# Patient Record
Sex: Female | Born: 1940 | Race: White | Hispanic: No | Marital: Married | Smoking: Former smoker
Health system: Southern US, Community
[De-identification: ages and names within clinical notes are randomized; demographics above are authoritative.]

## PROBLEM LIST (undated history)

## (undated) DIAGNOSIS — M810 Age-related osteoporosis without current pathological fracture: Secondary | ICD-10-CM

## (undated) DIAGNOSIS — N3281 Overactive bladder: Secondary | ICD-10-CM

## (undated) DIAGNOSIS — R51 Headache: Secondary | ICD-10-CM

## (undated) DIAGNOSIS — G35 Multiple sclerosis: Secondary | ICD-10-CM

## (undated) DIAGNOSIS — N189 Chronic kidney disease, unspecified: Secondary | ICD-10-CM

## (undated) DIAGNOSIS — K5090 Crohn's disease, unspecified, without complications: Secondary | ICD-10-CM

## (undated) DIAGNOSIS — R519 Headache, unspecified: Secondary | ICD-10-CM

## (undated) HISTORY — PX: ILEOSTOMY: SHX1783

## (undated) HISTORY — PX: TONSILLECTOMY: SUR1361

## (undated) HISTORY — PX: OTHER SURGICAL HISTORY: SHX169

## (undated) HISTORY — PX: FRACTURE SURGERY: SHX138

## (undated) HISTORY — PX: ABDOMINAL HYSTERECTOMY: SHX81

---

## 2018-03-26 ENCOUNTER — Inpatient Hospital Stay
Admission: EM | Admit: 2018-03-26 | Discharge: 2018-03-30 | DRG: 393 | Disposition: A | Discharge status: Skilled Nursing Facility | Payer: Medicare Other | Source: Skilled Nursing Facility | Attending: Internal Medicine | Admitting: Internal Medicine

## 2018-03-26 DIAGNOSIS — Z9842 Cataract extraction status, left eye: Secondary | ICD-10-CM

## 2018-03-26 DIAGNOSIS — M810 Age-related osteoporosis without current pathological fracture: Secondary | ICD-10-CM | POA: Diagnosis present

## 2018-03-26 DIAGNOSIS — G8929 Other chronic pain: Secondary | ICD-10-CM | POA: Diagnosis present

## 2018-03-26 DIAGNOSIS — Z90710 Acquired absence of both cervix and uterus: Secondary | ICD-10-CM

## 2018-03-26 DIAGNOSIS — G35 Multiple sclerosis: Secondary | ICD-10-CM | POA: Diagnosis present

## 2018-03-26 DIAGNOSIS — D539 Nutritional anemia, unspecified: Secondary | ICD-10-CM | POA: Diagnosis present

## 2018-03-26 DIAGNOSIS — N390 Urinary tract infection, site not specified: Secondary | ICD-10-CM

## 2018-03-26 DIAGNOSIS — K9411 Enterostomy hemorrhage: Secondary | ICD-10-CM | POA: Diagnosis not present

## 2018-03-26 DIAGNOSIS — K8080 Other cholelithiasis without obstruction: Secondary | ICD-10-CM

## 2018-03-26 DIAGNOSIS — F0390 Unspecified dementia without behavioral disturbance: Secondary | ICD-10-CM | POA: Diagnosis present

## 2018-03-26 DIAGNOSIS — N189 Chronic kidney disease, unspecified: Secondary | ICD-10-CM | POA: Diagnosis present

## 2018-03-26 DIAGNOSIS — Z9841 Cataract extraction status, right eye: Secondary | ICD-10-CM

## 2018-03-26 DIAGNOSIS — N3281 Overactive bladder: Secondary | ICD-10-CM | POA: Diagnosis present

## 2018-03-26 DIAGNOSIS — N200 Calculus of kidney: Secondary | ICD-10-CM | POA: Diagnosis not present

## 2018-03-26 DIAGNOSIS — Z9049 Acquired absence of other specified parts of digestive tract: Secondary | ICD-10-CM

## 2018-03-26 DIAGNOSIS — Z79899 Other long term (current) drug therapy: Secondary | ICD-10-CM

## 2018-03-26 DIAGNOSIS — Z6820 Body mass index (BMI) 20.0-20.9, adult: Secondary | ICD-10-CM

## 2018-03-26 DIAGNOSIS — N179 Acute kidney failure, unspecified: Secondary | ICD-10-CM | POA: Diagnosis present

## 2018-03-26 DIAGNOSIS — K5090 Crohn's disease, unspecified, without complications: Secondary | ICD-10-CM | POA: Diagnosis present

## 2018-03-26 DIAGNOSIS — Z66 Do not resuscitate: Secondary | ICD-10-CM | POA: Diagnosis present

## 2018-03-26 DIAGNOSIS — N136 Pyonephrosis: Secondary | ICD-10-CM | POA: Diagnosis present

## 2018-03-26 DIAGNOSIS — K219 Gastro-esophageal reflux disease without esophagitis: Secondary | ICD-10-CM | POA: Diagnosis present

## 2018-03-26 DIAGNOSIS — K922 Gastrointestinal hemorrhage, unspecified: Secondary | ICD-10-CM

## 2018-03-26 DIAGNOSIS — Z87891 Personal history of nicotine dependence: Secondary | ICD-10-CM

## 2018-03-26 DIAGNOSIS — E43 Unspecified severe protein-calorie malnutrition: Secondary | ICD-10-CM

## 2018-03-26 HISTORY — DX: Headache, unspecified: R51.9

## 2018-03-26 HISTORY — DX: Overactive bladder: N32.81

## 2018-03-26 HISTORY — DX: Multiple sclerosis: G35

## 2018-03-26 HISTORY — DX: Chronic kidney disease, unspecified: N18.9

## 2018-03-26 HISTORY — DX: Age-related osteoporosis without current pathological fracture: M81.0

## 2018-03-26 HISTORY — DX: Crohn's disease, unspecified, without complications: K50.90

## 2018-03-26 HISTORY — DX: Headache: R51

## 2018-03-26 NOTE — ED Triage Notes (Signed)
Patient presents to Emergency Department via EMS with complaints of bleeding and pain around ileostomy site.  Per EMS pt has what smells like GI bleed from ostomy bag.  Ostomy placed d/t Crohn's   Per pt pain and bleeding are from staff at [facility] ripping off pt's tape that holds the ostomy biscuit.  Pt is tremulous and credits this to staff not giving pt her hydrocodone that pt has been taking for the last 4 years for left shoulder pian/injury.  HPOA is daughter: Autumn Patty, [phone]

## 2018-03-27 ENCOUNTER — Encounter: Payer: Self-pay | Admitting: Internal Medicine

## 2018-03-27 ENCOUNTER — Other Ambulatory Visit: Payer: Self-pay

## 2018-03-27 ENCOUNTER — Emergency Department: Payer: Medicare Other

## 2018-03-27 DIAGNOSIS — Z980 Intestinal bypass and anastomosis status: Secondary | ICD-10-CM | POA: Diagnosis not present

## 2018-03-27 DIAGNOSIS — D539 Nutritional anemia, unspecified: Secondary | ICD-10-CM | POA: Diagnosis not present

## 2018-03-27 DIAGNOSIS — K922 Gastrointestinal hemorrhage, unspecified: Secondary | ICD-10-CM | POA: Diagnosis present

## 2018-03-27 DIAGNOSIS — K8681 Exocrine pancreatic insufficiency: Secondary | ICD-10-CM

## 2018-03-27 LAB — URINALYSIS, COMPLETE (UACMP) WITH MICROSCOPIC
Bilirubin Urine: NEGATIVE
Glucose, UA: NEGATIVE mg/dL
Hgb urine dipstick: NEGATIVE
Ketones, ur: NEGATIVE mg/dL
Nitrite: NEGATIVE
PH: 5 (ref 5.0–8.0)
Protein, ur: 30 mg/dL — AB
SPECIFIC GRAVITY, URINE: 1.021 (ref 1.005–1.030)
SQUAMOUS EPITHELIAL / LPF: NONE SEEN (ref 0–5)
WBC, UA: >50 WBC/hpf — ABNORMAL HIGH (ref 0–5)

## 2018-03-27 LAB — COMPREHENSIVE METABOLIC PANEL
ALBUMIN: 3.2 g/dL — AB (ref 3.5–5.0)
ALK PHOS: 59 U/L (ref 38–126)
ALT: 11 U/L — ABNORMAL LOW (ref 14–54)
AST: 27 U/L (ref 15–41)
Anion gap: 8 (ref 5–15)
BUN: 38 mg/dL — ABNORMAL HIGH (ref 6–20)
CALCIUM: 8.9 mg/dL (ref 8.9–10.3)
CO2: 19 mmol/L — AB (ref 22–32)
Chloride: 116 mmol/L — ABNORMAL HIGH (ref 101–111)
Creatinine, Ser: 2.14 mg/dL — ABNORMAL HIGH (ref 0.44–1.00)
GFR calc Af Amer: 25 mL/min — ABNORMAL LOW (ref >60)
GFR calc non Af Amer: 22 mL/min — ABNORMAL LOW (ref >60)
GLUCOSE: 100 mg/dL — AB (ref 65–99)
POTASSIUM: 3.3 mmol/L — AB (ref 3.5–5.1)
SODIUM: 143 mmol/L (ref 135–145)
Total Bilirubin: 0.4 mg/dL (ref 0.3–1.2)
Total Protein: 6.4 g/dL — ABNORMAL LOW (ref 6.5–8.1)

## 2018-03-27 LAB — PROTIME-INR
INR: 0.99
Prothrombin Time: 13 seconds (ref 11.4–15.2)

## 2018-03-27 LAB — CBC WITH DIFFERENTIAL/PLATELET
BASOS PCT: 1 %
Basophils Absolute: 0.1 10*3/uL (ref 0–0.1)
EOS ABS: 0.1 10*3/uL (ref 0–0.7)
Eosinophils Relative: 1 %
HCT: 28.6 % — ABNORMAL LOW (ref 35.0–47.0)
HEMOGLOBIN: 9.3 g/dL — AB (ref 12.0–16.0)
Lymphocytes Relative: 15 %
Lymphs Abs: 1.5 10*3/uL (ref 1.0–3.6)
MCH: 32.6 pg (ref 26.0–34.0)
MCHC: 32.5 g/dL (ref 32.0–36.0)
MCV: 100.4 fL — ABNORMAL HIGH (ref 80.0–100.0)
MONOS PCT: 5 %
Monocytes Absolute: 0.5 10*3/uL (ref 0.2–0.9)
NEUTROS PCT: 78 %
Neutro Abs: 7.6 10*3/uL — ABNORMAL HIGH (ref 1.4–6.5)
Platelets: 502 10*3/uL — ABNORMAL HIGH (ref 150–440)
RBC: 2.85 MIL/uL — AB (ref 3.80–5.20)
RDW: 19.2 % — ABNORMAL HIGH (ref 11.5–14.5)
WBC: 9.7 10*3/uL (ref 3.6–11.0)

## 2018-03-27 LAB — HEMOGLOBIN A1C
Hgb A1c MFr Bld: 4.4 % — ABNORMAL LOW (ref 4.8–5.6)
Mean Plasma Glucose: 79.58 mg/dL

## 2018-03-27 LAB — HEMOGLOBIN AND HEMATOCRIT, BLOOD
HEMATOCRIT: 22 % — AB (ref 35.0–47.0)
HEMOGLOBIN: 7.1 g/dL — AB (ref 12.0–16.0)

## 2018-03-27 LAB — FOLATE: FOLATE: 40 ng/mL (ref >5.9)

## 2018-03-27 LAB — IRON AND TIBC
Iron: 42 ug/dL (ref 28–170)
SATURATION RATIOS: 19 % (ref 10.4–31.8)
TIBC: 220 ug/dL — AB (ref 250–450)
UIBC: 178 ug/dL

## 2018-03-27 LAB — FERRITIN: Ferritin: 526 ng/mL — ABNORMAL HIGH (ref 11–307)

## 2018-03-27 LAB — TSH: TSH: 5.966 u[IU]/mL — ABNORMAL HIGH (ref 0.350–4.500)

## 2018-03-27 LAB — LIPASE, BLOOD: Lipase: 60 U/L — ABNORMAL HIGH (ref 11–51)

## 2018-03-27 LAB — VITAMIN B12: VITAMIN B 12: 1397 pg/mL — AB (ref 180–914)

## 2018-03-27 LAB — TROPONIN I: Troponin I: <0.03 ng/mL (ref <0.03)

## 2018-03-27 MED ORDER — TRAMADOL HCL 50 MG PO TABS
50.0000 mg | ORAL_TABLET | Freq: Three times a day (TID) | ORAL | Status: DC | PRN
  Administered 2018-03-27 – 2018-03-30 (×7): 50 mg via ORAL
  Filled 2018-03-27 (×8): qty 1

## 2018-03-27 MED ORDER — ONDANSETRON HCL 4 MG/2ML IJ SOLN
4.0000 mg | Freq: Four times a day (QID) | INTRAMUSCULAR | Status: DC | PRN
  Administered 2018-03-30: 4 mg via INTRAVENOUS
  Filled 2018-03-27: qty 2

## 2018-03-27 MED ORDER — SODIUM CHLORIDE 0.9 % IV SOLN
1.0000 g | Freq: Once | INTRAVENOUS | Status: AC
  Administered 2018-03-27: 1 g via INTRAVENOUS
  Filled 2018-03-27: qty 10

## 2018-03-27 MED ORDER — POTASSIUM CHLORIDE IN NACL 20-0.9 MEQ/L-% IV SOLN
INTRAVENOUS | Status: DC
  Administered 2018-03-27 (×2): via INTRAVENOUS
  Filled 2018-03-27 (×6): qty 1000

## 2018-03-27 MED ORDER — DOCUSATE SODIUM 100 MG PO CAPS
100.0000 mg | ORAL_CAPSULE | Freq: Two times a day (BID) | ORAL | Status: DC
  Filled 2018-03-27 (×3): qty 1

## 2018-03-27 MED ORDER — SODIUM CHLORIDE 0.9 % IV SOLN
1.0000 g | INTRAVENOUS | Status: DC
  Administered 2018-03-27 – 2018-03-28 (×2): 1 g via INTRAVENOUS
  Filled 2018-03-27: qty 10
  Filled 2018-03-27 (×2): qty 1
  Filled 2018-03-27: qty 10

## 2018-03-27 MED ORDER — PANCRELIPASE (LIP-PROT-AMYL) 12000-38000 UNITS PO CPEP
72000.0000 [IU] | ORAL_CAPSULE | Freq: Three times a day (TID) | ORAL | Status: DC
  Administered 2018-03-27 – 2018-03-30 (×6): 72000 [IU] via ORAL
  Filled 2018-03-27 (×10): qty 6

## 2018-03-27 MED ORDER — FAMOTIDINE IN NACL 20-0.9 MG/50ML-% IV SOLN
20.0000 mg | INTRAVENOUS | Status: DC
  Administered 2018-03-27 – 2018-03-30 (×4): 20 mg via INTRAVENOUS
  Filled 2018-03-27 (×4): qty 50

## 2018-03-27 MED ORDER — ONDANSETRON HCL 4 MG PO TABS: 4.0000 mg | ORAL_TABLET | Freq: Four times a day (QID) | ORAL | Status: DC | PRN

## 2018-03-27 MED ORDER — ENSURE ENLIVE PO LIQD
237.0000 mL | Freq: Three times a day (TID) | ORAL | Status: DC
  Administered 2018-03-27 – 2018-03-29 (×4): 237 mL via ORAL

## 2018-03-27 MED ORDER — ACETAMINOPHEN 325 MG PO TABS: 650.0000 mg | ORAL_TABLET | Freq: Four times a day (QID) | ORAL | Status: DC | PRN

## 2018-03-27 MED ORDER — ACETAMINOPHEN 650 MG RE SUPP: 650.0000 mg | Freq: Four times a day (QID) | RECTAL | Status: DC | PRN

## 2018-03-27 MED ORDER — SODIUM CHLORIDE 0.9 % IV BOLUS
1000.0000 mL | Freq: Once | INTRAVENOUS | Status: AC
  Administered 2018-03-27: 1000 mL via INTRAVENOUS

## 2018-03-27 MED ORDER — IOPAMIDOL (ISOVUE-300) INJECTION 61%
15.0000 mL | INTRAVENOUS | Status: DC
  Administered 2018-03-27: 15 mL via ORAL

## 2018-03-27 NOTE — Progress Notes (Signed)
Patient is tired of fighting and unsure why she has been taken to the hospital again. Patient asked Chaplain to contact her daughter and let her know that she is a patient. Patient did not want the Chaplain to contact her husband, who has dementia. Chaplain offered active listening, emotional and spiritual support, and prayer.

## 2018-03-27 NOTE — Progress Notes (Signed)
   03/27/18 0755  Clinical Encounter Type  Visited With Patient not available  Visit Type Initial  Referral From Nurse  Consult/Referral To Chaplain  Spiritual Encounters  Spiritual Needs Other (Comment)   CH received an OR to pray with PT. CH reported to PT's RM and PT was receiving care. CH will pass OR to oncoming CH.

## 2018-03-27 NOTE — NC FL2 (Signed)
[place] MEDICAID FL2 LEVEL OF CARE SCREENING TOOL     IDENTIFICATION  Patient Name: Adrienne Patel Birthdate: 09/29/1941 Sex: female Admission Date (Current Location): 03/26/2018  [place] and [place] Number:  Chiropodist and Address:  [facility], [address] [place], [place] [address]      Provider Number: [id number]  Attending Physician Name and Address:  Ramonita Lab, MD  Relative Name and Phone Number:  Autumn Patty (daughter) [phone]    Current Level of Care: Hospital Recommended Level of Care: Skilled Nursing Facility Prior Approval Number:    Date Approved/Denied:   PASRR Number: [id number]  Discharge Plan: SNF    Current Diagnoses: Patient Active Problem List   Diagnosis Date Noted  . GIB (gastrointestinal bleeding) 03/27/2018    Orientation RESPIRATION BLADDER Height & Weight     Self, Time, Situation, Place  Normal Incontinent Weight: 145 lb (65.8 kg) Height:  5\' 5"  (165.1 cm)  BEHAVIORAL SYMPTOMS/MOOD NEUROLOGICAL BOWEL NUTRITION STATUS      Colostomy Diet(As listed on the discharge summary)  AMBULATORY STATUS COMMUNICATION OF NEEDS Skin   Extensive Assist Verbally Skin abrasions                       Personal Care Assistance Level of Assistance  Bathing, Feeding, Dressing Bathing Assistance: Limited assistance Feeding assistance: Independent Dressing Assistance: Limited assistance     Functional Limitations Info  Sight, Hearing, Speech Sight Info: Adequate Hearing Info: Adequate Speech Info: Adequate    SPECIAL CARE FACTORS FREQUENCY  PT (By licensed PT)     PT Frequency: Resumption of PT originally ordered for short term rehabilitation              Contractures Contractures Info: Not present    Additional Factors Info  Code Status, Allergies Code Status Info: DNR Allergies Info: No known Allergies           Current Medications (03/27/2018):  This is the current hospital  active medication list Current Facility-Administered Medications  Medication Dose Route Frequency Provider Last Rate Last Dose  . 0.9 % NaCl with KCl 20 mEq/ L  infusion   Intravenous Continuous Arnaldo Natal, MD 100 mL/hr at 03/27/18 2774    . acetaminophen (TYLENOL) tablet 650 mg  650 mg Oral Q6H PRN Arnaldo Natal, MD       Or  . acetaminophen (TYLENOL) suppository 650 mg  650 mg Rectal Q6H PRN Arnaldo Natal, MD      . cefTRIAXone (ROCEPHIN) 1 g in sodium chloride 0.9 % 100 mL IVPB  1 g Intravenous Q24H Arnaldo Natal, MD      . docusate sodium (COLACE) capsule 100 mg  100 mg Oral BID Arnaldo Natal, MD      . famotidine (PEPCID) IVPB 20 mg premix  20 mg Intravenous Q24H Arnaldo Natal, MD   Stopped at 03/27/18 0830  . ondansetron (ZOFRAN) tablet 4 mg  4 mg Oral Q6H PRN Arnaldo Natal, MD       Or  . ondansetron [facility]) injection 4 mg  4 mg Intravenous Q6H PRN Arnaldo Natal, MD      . traMADol Janean Sark) tablet 50 mg  50 mg Oral Q8H PRN Ramonita Lab, MD         Discharge Medications: Please see discharge summary for a list of discharge medications.  Relevant Imaging Results:  Relevant Lab Results:   Additional Information SS# [id number]  Clydie Braun  M Laure Leone, LCSW

## 2018-03-27 NOTE — ED Notes (Signed)
Robin, RN called for pt arrival pending and new IV site, rocephin finished att

## 2018-03-27 NOTE — ED Provider Notes (Signed)
[facility] Emergency Department Provider Note   ____________________________________________   First MD Initiated Contact with Patient 03/27/18 0000     (approximate)  I have reviewed the triage vital signs and the nursing notes.   HISTORY  Chief Complaint bleeding from ileostomy site  Level 5 caveat: Limited by patient's dementia  HPI Adrienne Patel is a 77 y.o. female sent to the ED via EMS from skilled nursing facility with a chief complaint of GI bleed.  Patient with a history of Crohn's disease status post ileostomy.  Staff report GI bleed this evening.  Patient reports she was asleep and EMS woke her up for transport.  Takes hydrocodone for chronic pain.  Her only complaint is that she was not given her usual evening Norco.  Denies chest pain, shortness of breath, abdominal pain, nausea, vomiting.  Reports normal volume of stool in her ostomy bag this weekend.  Denies use of anticoagulants.   Past Medical History . Anemia  . Anxiety  . Arthritis  . Cataract  . Crohn's disease (CMS-HCC)  . Deep vein thrombosis (CMS-HCC)  . Dementia  . Depression  . Fractures  . GERD (gastroesophageal reflux disease)  . History of transfusion  . Multiple sclerosis (CMS-HCC)  . Osteoporosis  . Peptic ulceration  . Shoulder injury   Past Surgical History . ABDOMINAL SURGERY  . APPENDECTOMY  . CATARACT EXTRACTION Right 12/26/2008  [facility]  . CATARACT EXTRACTION Left 02/06/2009  [facility]  . COLON SURGERY  . EYE SURGERY  . FRACTURE SURGERY  . HYSTERECTOMY 1980's  . ILEOSTOMY  . many  . PR ILEOSCOPY THRU STOMA DX W/COLLJ SPEC WHEN PRFMD N/A 08/18/2013  Procedure: ILEOSCOPY-STOMA; DX W/WO COLLEC SPECMN (SEP PRO); Surgeon: Theadore Nan, MD; Location: [facility] PROCEDURES [facility]; Service: Gastroenterology  . PR ILEOSCOPY THRU STOMA,BIOPSY N/A 02/27/2013  Procedure: Karren Cobble; [place] Saa 1/MX; Surgeon: Billie Ruddy, MD; [facility]; Service: Gastroenterology  . PR ILEOSCOPY THRU STOMA,BIOPSY N/A 03/02/2013  Procedure: Karren Cobble; [place] Saa 1/MX; Surgeon: Theadore Nan, MD; [facility]; Service: Gastroenterology  . PR UPPER GI ENDOSCOPY,BIOPSY N/A 08/18/2013  Procedure: UGI ENDOSCOPY; WITH BIOPSY, SINGLE OR MULTIPLE; Surgeon: Theadore Nan, MD; Location: [facility] PROC[facility]URES [facility]; Service: Gastroenterology  . PR UPPER GI ENDOSCOPY,DIAGNOSIS N/A 02/27/2013  Procedure: UGI ENDO, INCLUDE ESOPHAGUS, STOMACH, & DUODENUM &/OR JEJUNUM; DX W/WO COLLECTION SPECIMN, BY BRUSH OR WASH; Surgeon: Billie Ruddy, MD; Location: [facility]; Service: Gastroenterology  . TONSILLECTOMY   Patient Active Problem List   Diagnosis Date Noted  . GIB (gastrointestinal bleeding) 03/27/2018    Prior to Admission medications   Medication Sig Start Date End Date Taking? Authorizing Provider  acetaminophen (TYLENOL) 500 MG tablet Take 1,000 mg by mouth every 8 (eight) hours.   Yes [provider]  amphetamine-dextroamphetamine (ADDERALL XR) 15 MG 24 hr capsule Take 30 mg by mouth every morning.   Yes [provider]  Cholecalciferol (VITAMIN D3) POWD Take 800 Units by mouth daily.   Yes [provider]  cyanocobalamin (,VITAMIN B-12,) 1000 MCG/ML injection Inject 1,000 mcg into the muscle every 30 (thirty) days.   Yes [provider]  escitalopram (LEXAPRO) 20 MG tablet Take 20 mg by mouth daily.   Yes [provider]  fesoterodine (TOVIAZ) 4 MG TB24 tablet Take 4 mg by mouth daily.   Yes [provider]  magnesium gluconate (MAGONATE) 500 MG tablet Take 500 mg by mouth 2 (two) times  daily.   Yes [provider]  Multiple Vitamin (MULTIVITAMIN) capsule Take 2 capsules by mouth daily.   Yes [provider]  pantoprazole (PROTONIX) 40 MG tablet Take 40 mg by mouth daily.   Yes [provider]  tuberculin 5 UNIT/0.1ML  injection Inject 5 Units into the skin every 7 (seven) days. 03/15/18 03/29/18 Yes [provider]    Allergies Patient has no known allergies.  No family history on file.  Social History Social History   Tobacco Use  . Smoking status: Not on file  Substance Use Topics  . Alcohol use: Not on file  . Drug use: Not on file  Non-smoker  Review of Systems  Constitutional: No fever/chills Eyes: No visual changes. ENT: No sore throat. Cardiovascular: Denies chest pain. Respiratory: Denies shortness of breath. Gastrointestinal: Positive for GI bleed.  No abdominal pain.  No nausea, no vomiting.  No diarrhea.  No constipation. Genitourinary: Negative for dysuria. Musculoskeletal: Negative for back pain. Skin: Negative for rash. Neurological: Negative for headaches, focal weakness or numbness.   ____________________________________________   PHYSICAL EXAM:  VITAL SIGNS: ED Triage Vitals  Enc Vitals Group     BP 03/26/18 2359 111/73     Pulse Rate 03/26/18 2359 (!) 103     Resp 03/26/18 2359 20     Temp 03/26/18 2359 97.7 F (36.5 C)     Temp Source 03/26/18 2359 Oral     SpO2 03/26/18 2354 100 %     Weight 03/27/18 0000 145 lb (65.8 kg)     Height 03/27/18 0000 5\' 5"  (1.651 m)     Head Circumference --      Peak Flow --      Pain Score 03/27/18 0000 6     Pain Loc --      Pain Edu? --      Excl. in GC? --     Constitutional: Alert and oriented.  Elderly appearing and in no acute distress. Eyes: Conjunctivae are normal. PERRL. EOMI. Head: Atraumatic. Nose: No congestion/rhinnorhea. Mouth/Throat: Mucous membranes are moist.  Oropharynx non-erythematous. Neck: No stridor.   Cardiovascular: Normal rate, regular rhythm. Grossly normal heart sounds.  Good peripheral circulation. Respiratory: Normal respiratory effort.  No retractions. Lungs CTAB. Gastrointestinal: Ostomy bag removed to find dark, gross blood mixed with stool.  Ostomy site cleaned with gauze.   Slow venous oozing noted.  Soft and nontender to light or deep palpation. No distention. No abdominal bruits. No CVA tenderness. Musculoskeletal: No lower extremity tenderness nor edema.  No joint effusions. Neurologic:  Normal speech and language. No gross focal neurologic deficits are appreciated.  Skin:  Skin is warm, dry and intact. No rash noted. Psychiatric: Mood and affect are normal. Speech and behavior are normal.  ____________________________________________   LABS (all labs ordered are listed, but only abnormal results are displayed)  Labs Reviewed  CBC WITH DIFFERENTIAL/PLATELET - Abnormal; Notable for the following components:      Result Value   RBC 2.85 (*)    Hemoglobin 9.3 (*)    HCT 28.6 (*)    MCV 100.4 (*)    RDW 19.2 (*)    Platelets 502 (*)    Neutro Abs 7.6 (*)    All other components within normal limits  COMPREHENSIVE METABOLIC PANEL - Abnormal; Notable for the following components:   Potassium 3.3 (*)    Chloride 116 (*)    CO2 19 (*)    Glucose, Bld 100 (*)  BUN 38 (*)    Creatinine, Ser 2.14 (*)    Total Protein 6.4 (*)    Albumin 3.2 (*)    ALT 11 (*)    GFR calc non Af Amer 22 (*)    GFR calc Af Amer 25 (*)    All other components within normal limits  LIPASE, BLOOD - Abnormal; Notable for the following components:   Lipase 60 (*)    All other components within normal limits  URINALYSIS, COMPLETE (UACMP) WITH MICROSCOPIC - Abnormal; Notable for the following components:   Color, Urine AMBER (*)    APPearance CLOUDY (*)    Protein, ur 30 (*)    Leukocytes, UA LARGE (*)    WBC, UA >50 (*)    Bacteria, UA RARE (*)    All other components within normal limits  URINE CULTURE  TROPONIN I  PROTIME-INR  TYPE AND SCREEN   ____________________________________________  EKG  ED ECG REPORT I, Nell Schrack J, the attending physician, personally viewed and interpreted this ECG.   Date: 03/27/2018  EKG Time: 0048  Rate: 97  Rhythm: normal EKG,  normal sinus rhythm  Axis: Normal  Intervals:none  ST&T Change: Nonspecific  ____________________________________________  RADIOLOGY  ED MD interpretation: Peribronchial thickening; CT without inflammatory changes to the bowel, cholelithiasis and nephrolithiasis  Official radiology report(s): Ct Abdomen Pelvis Wo Contrast  Result Date: 03/27/2018 CLINICAL DATA:  Pain and bleeding via ileostomy site. History of Crohn's disease. EXAM: CT ABDOMEN AND PELVIS WITHOUT CONTRAST TECHNIQUE: Multidetector CT imaging of the abdomen and pelvis was performed following the standard protocol without IV contrast. Enteric contrast administered. COMPARISON:  None. FINDINGS: LOWER CHEST: Minimal atelectasis/scarring. The visualized heart size is normal. Partially imaged coronary artery calcification. No pericardial effusion. HEPATOBILIARY: Multiple mildly dense gallstones with mild gallbladder wall thickening. Common bile duct is 15 mm in transaxial dimension. No identified choledocholithiasis. PANCREAS: Normal. SPLEEN: Normal. ADRENALS/URINARY TRACT: Mildly low-lying LEFT kidney with moderate hydroureteronephrosis. Mildly atrophic LEFT kidney. 1 x 2 cm LEFT renal pelvis calculus. Punctate nonobstructing RIGHT nephrolithiasis. Limited assessment for renal masses by nonenhanced CT. Subcentimeter RIGHT upper pole dense exophytic probable hemorrhagic cyst. The unopacified ureters are normal in course and caliber. Urinary bladder is partially distended and unremarkable. Normal adrenal glands. STOMACH/BOWEL: Surgical clips about the GE junction compatible with fundoplication. Status post gastro jejunostomy with thickened appearance of proximal anastomosis. Contrast in the biliary loop and feeding loops. Contrast to the level of the ileostomy and within bag. Status post colectomy. VASCULAR/LYMPHATIC: Aortoiliac vessels are normal in course and caliber. Moderate calcific atherosclerosis. No lymphadenopathy by CT size criteria.  REPRODUCTIVE: Status post hysterectomy. OTHER: No intraperitoneal free fluid or free air. MUSCULOSKELETAL: Non-acute. Thoracolumbar levoscoliosis. Osteopenia. Severe L2-3 and L3-4 degenerative discs. Anterior abdominal wall scarring. IMPRESSION: 1. Cholelithiasis and suspected acute cholecystitis. Biliary dilatation without identified choledocholithiasis. 2. Age indeterminate thickened gastrojejunostomy anastomosis, possibly inflammatory without bowel obstruction. Status post colectomy. 3. Moderate LEFT hydroureteronephrosis, UVJ stricture or mass possible. 1 x 2 cm LEFT nephrolithiasis. Electronically Signed   By: Awilda Metro M.D.   On: 03/27/2018 03:34   Dg Chest Port 1 View  Result Date: 03/27/2018 CLINICAL DATA:  Acute onset of bleeding and pain about the ileostomy site. EXAM: PORTABLE CHEST 1 VIEW COMPARISON:  None. FINDINGS: The lungs are well-aerated. Peribronchial thickening is noted. Vague right midlung opacity is nonspecific; though it could reflect scarring, pneumonia cannot be excluded. There is no evidence of pleural effusion or pneumothorax. The cardiomediastinal silhouette is within normal  limits. No acute osseous abnormalities are seen. Postoperative change is noted about the gastroesophageal junction. Cervical spinal fusion hardware is noted. IMPRESSION: Peribronchial thickening. Vague right midlung opacity is nonspecific. Though it could reflect scarring, pneumonia cannot be excluded. Followup PA and lateral chest X-ray is recommended in 3-4 weeks following trial of antibiotic therapy (if the patient has symptoms of pneumonia) to ensure resolution and exclude underlying malignancy. Alternatively, CT could be considered to exclude underlying mass. Electronically Signed   By: Roanna Raider M.D.   On: 03/27/2018 00:46    ____________________________________________   PROCEDURES  Procedure(s) performed: None  Procedures  Critical Care performed:  No  ____________________________________________   INITIAL IMPRESSION / ASSESSMENT AND PLAN / ED COURSE  As part of my medical decision making, I reviewed the following data within the electronic MEDICAL RECORD NUMBER Nursing notes reviewed and incorporated, Labs reviewed, EKG interpreted, Old chart reviewed and Notes from prior ED visits   77 year old female with Crohn's disease status post ileostomy who presents with GI bleed.  Differential diagnosis includes but is not limited to Crohn's exacerbation, colitis, esophagitis, etc.  Will obtain screening lab work including INR, type and screen.  Will initiate IV fluid resuscitation.  Norco given for patient's chronic pain.  Will obtain CT abdomen/pelvis to evaluate etiology of patient's symptoms.  Anticipate hospitalization.   Clinical Course as of Mar 27 349  Sun Mar 27, 2018  0123 Reviewed patient's recent admission from [facility]: On 03/15/2018 her H/H was 9.5/29.4   [JS]  0148 BUN/Cr 47/2.10 on 03/15/2018   [JS]  0214 Patient asleep.  Sitter at bedside to assist her to finish oral contrast.  Reexamined ostomy bag which has mild amount of bloody stool.  There is no extensive hemorrhage.   [JS]  0344 Patient asleep.  1 g IV Rocephin ordered for UTI.  CT scan results noted.  Clinically, patient does not have cholecystitis.  She is not tender in her epigastrium or right upper quadrant.  There is no flank pain.  Discussed with hospitalist to evaluate patient in the emergency department for admission for further evaluation of GI bleed.   [JS]    Clinical Course User Index [JS] Irean Hong, MD     ____________________________________________   FINAL CLINICAL IMPRESSION(S) / ED DIAGNOSES  Final diagnoses:  Gastrointestinal hemorrhage, unspecified gastrointestinal hemorrhage type  Urinary tract infection without hematuria, site unspecified  Biliary calculus of other site without obstruction  Kidney stone     ED Discharge Orders    None        Note:  This document was prepared using Dragon voice recognition software and may include unintentional dictation errors.    Irean Hong, MD 03/27/18 [phone]

## 2018-03-27 NOTE — Progress Notes (Signed)
Chaplain spoke with the patient's daughter and relayed her mother's request. Daughter is in route to [employer].

## 2018-03-27 NOTE — ED Notes (Signed)
Pt c/o pain at right IV site, unsure of infiltration, Sheema at pharmacy consulted advised to apply cold compress, Charge Lea, RN notified advised to DC site, pt informed

## 2018-03-27 NOTE — Care Management Note (Addendum)
Case Management Note  Patient Details  Name: Adrienne Patel MRN: [id number] Date of Birth: 12-02-1940  Subjective/Objective:    Patient admitted to [facility] under observation status. Patient is lethargic at times and orientation level is not within normal limits at this time. Per patient her daughter is POA. RNCM called POA Tracy Bullins [phone]. She was able to assist appropriately with the [facility] consult. Per [name], patient is from [facility] and is going through rehab services. She has used [facility] in the past when she was at home with her husband, prior to rehab. Has DME at home such as walker, BSC, and shower chair. Daughter expresses concern over patient returning home since patients spouse has dementia and she has safety concerns about her being at home opposed to rehab. She has used private caregivers in the past as well. [name] reports mild improvement in patients physical strength, ambulatory status and ability to complete activities of daily living. Patient uses [facility] as a pharmacy and has no medication needs.  PCP is Dr Dietrich Pates at [facility] healthcare whom she follows with regularly.             Action/Plan:  Patient with GIB. GI consult pending. Potential return to rehab. Daughter would like to speak with a Child psychotherapist over safety concerns she has at [facility].  Expected Discharge Date:  03/29/18               Expected Discharge Plan:     In-House Referral:     Discharge planning Services     Post Acute Care Choice:    Choice offered to:     DME Arranged:    DME Agency:     HH Arranged:    HH Agency:     Status of Service:     If discussed at [employer], dates discussed:    Additional Comments:  Rehmat Murtagh A, RN 03/27/2018, 9:57 AM

## 2018-03-27 NOTE — Progress Notes (Signed)
[facility] - [place] at [facility]   PATIENT NAME: Adrienne Patel    MR#:  [id number]  DATE OF BIRTH:  01-26-1941  SUBJECTIVE:  CHIEF COMPLAINT: Patient is resting comfortably with no complaints.  Did not notice any blood in her ileostomy today  REVIEW OF SYSTEMS:  CONSTITUTIONAL: No fever, fatigue or weakness.  EYES: No blurred or double vision.  EARS, NOSE, AND THROAT: No tinnitus or ear pain.  RESPIRATORY: No cough, shortness of breath, wheezing or hemoptysis.  CARDIOVASCULAR: No chest pain, orthopnea, edema.  GASTROINTESTINAL: No nausea, vomiting, diarrhea or abdominal pain.  GENITOURINARY: No dysuria, hematuria.  ENDOCRINE: No polyuria, nocturia,  HEMATOLOGY: No anemia, easy bruising or bleeding SKIN: No rash or lesion. MUSCULOSKELETAL: No joint pain or arthritis.   NEUROLOGIC: No tingling, numbness, weakness.  PSYCHIATRY: No anxiety or depression.   DRUG ALLERGIES:  No Known Allergies  VITALS:  Blood pressure (!) 101/53, pulse (!) 115, temperature 98.4 F (36.9 C), temperature source Oral, resp. rate 18, height 5\' 5"  (1.651 m), weight 65.8 kg (145 lb), SpO2 98 %.  PHYSICAL EXAMINATION:  GENERAL:  77 y.o.-year-old patient lying in the bed with no acute distress.  EYES: Pupils equal, round, reactive to light and accommodation. No scleral icterus. Extraocular muscles intact.  HEENT: Head atraumatic, normocephalic. Oropharynx and nasopharynx clear.  NECK:  Supple, no jugular venous distention. No thyroid enlargement, no tenderness.  LUNGS: Normal breath sounds bilaterally, no wheezing, rales,rhonchi or crepitation. No use of accessory muscles of respiration.  CARDIOVASCULAR: S1, S2 normal. No murmurs, rubs, or gallops.  ABDOMEN: Soft, nontender, nondistended. Bowel sounds present.  Ileostomy site intact with brown stool EXTREMITIES: No pedal edema, cyanosis, or clubbing.  NEUROLOGIC: Cranial nerves II through XII are intact. Muscle strength 5/5 in  all extremities. Sensation intact. Gait not checked.  PSYCHIATRIC: The patient is alert and oriented x 3.  SKIN: No obvious rash, lesion, or ulcer.    LABORATORY PANEL:   CBC Recent Labs  Lab 03/27/18 0034  WBC 9.7  HGB 9.3*  HCT 28.6*  PLT 502*   ------------------------------------------------------------------------------------------------------------------  Chemistries  Recent Labs  Lab 03/27/18 0034  NA 143  K 3.3*  CL 116*  CO2 19*  GLUCOSE 100*  BUN 38*  CREATININE 2.14*  CALCIUM 8.9  AST 27  ALT 11*  ALKPHOS 59  BILITOT 0.4   ------------------------------------------------------------------------------------------------------------------  Cardiac Enzymes Recent Labs  Lab 03/27/18 0034  TROPONINI <0.03   ------------------------------------------------------------------------------------------------------------------  RADIOLOGY:  Ct Abdomen Pelvis Wo Contrast  Result Date: 03/27/2018 CLINICAL DATA:  Pain and bleeding via ileostomy site. History of Crohn's disease. EXAM: CT ABDOMEN AND PELVIS WITHOUT CONTRAST TECHNIQUE: Multidetector CT imaging of the abdomen and pelvis was performed following the standard protocol without IV contrast. Enteric contrast administered. COMPARISON:  None. FINDINGS: LOWER CHEST: Minimal atelectasis/scarring. The visualized heart size is normal. Partially imaged coronary artery calcification. No pericardial effusion. HEPATOBILIARY: Multiple mildly dense gallstones with mild gallbladder wall thickening. Common bile duct is 15 mm in transaxial dimension. No identified choledocholithiasis. PANCREAS: Normal. SPLEEN: Normal. ADRENALS/URINARY TRACT: Mildly low-lying LEFT kidney with moderate hydroureteronephrosis. Mildly atrophic LEFT kidney. 1 x 2 cm LEFT renal pelvis calculus. Punctate nonobstructing RIGHT nephrolithiasis. Limited assessment for renal masses by nonenhanced CT. Subcentimeter RIGHT upper pole dense exophytic probable  hemorrhagic cyst. The unopacified ureters are normal in course and caliber. Urinary bladder is partially distended and unremarkable. Normal adrenal glands. STOMACH/BOWEL: Surgical clips about the GE junction compatible with fundoplication. Status post gastro jejunostomy  with thickened appearance of proximal anastomosis. Contrast in the biliary loop and feeding loops. Contrast to the level of the ileostomy and within bag. Status post colectomy. VASCULAR/LYMPHATIC: Aortoiliac vessels are normal in course and caliber. Moderate calcific atherosclerosis. No lymphadenopathy by CT size criteria. REPRODUCTIVE: Status post hysterectomy. OTHER: No intraperitoneal free fluid or free air. MUSCULOSKELETAL: Non-acute. Thoracolumbar levoscoliosis. Osteopenia. Severe L2-3 and L3-4 degenerative discs. Anterior abdominal wall scarring. IMPRESSION: 1. Cholelithiasis and suspected acute cholecystitis. Biliary dilatation without identified choledocholithiasis. 2. Age indeterminate thickened gastrojejunostomy anastomosis, possibly inflammatory without bowel obstruction. Status post colectomy. 3. Moderate LEFT hydroureteronephrosis, UVJ stricture or mass possible. 1 x 2 cm LEFT nephrolithiasis. Electronically Signed   By: Awilda Metro M.D.   On: 03/27/2018 03:34   Dg Chest Port 1 View  Result Date: 03/27/2018 CLINICAL DATA:  Acute onset of bleeding and pain about the ileostomy site. EXAM: PORTABLE CHEST 1 VIEW COMPARISON:  None. FINDINGS: The lungs are well-aerated. Peribronchial thickening is noted. Vague right midlung opacity is nonspecific; though it could reflect scarring, pneumonia cannot be excluded. There is no evidence of pleural effusion or pneumothorax. The cardiomediastinal silhouette is within normal limits. No acute osseous abnormalities are seen. Postoperative change is noted about the gastroesophageal junction. Cervical spinal fusion hardware is noted. IMPRESSION: Peribronchial thickening. Vague right midlung  opacity is nonspecific. Though it could reflect scarring, pneumonia cannot be excluded. Followup PA and lateral chest X-ray is recommended in 3-4 weeks following trial of antibiotic therapy (if the patient has symptoms of pneumonia) to ensure resolution and exclude underlying malignancy. Alternatively, CT could be considered to exclude underlying mass. Electronically Signed   By: Roanna Raider M.D.   On: 03/27/2018 00:46    EKG:   Orders placed or performed during the hospital encounter of 03/26/18  . ED EKG  . ED EKG  . EKG 12-Lead  . EKG 12-Lead    ASSESSMENT AND PLAN:    This is a 77 year old female admitted for GI bleeding. 1.  GI bleed: Melena; she has underlying Crohn's disease.  Patient hemoglobin is glad gradually declining over the past several months. Monitor hemoglobin and hematocrit closely  CT of the abdomen is negative for any acute process. Patient is seen by GI, recommending work-up for macrocytic anemia including ferritin, iron studies, B12, folate, zinc and copper levels Patient is started on Creon and nutrition supplements Ensure 3 times a day Resume diet   Pepcid IV for now (Protonix on national back order).    hemodynamically stable.  2.  UTI: Present on admission; continue Rocephin, urine culture and sensitivity pending  3.  Overactive bladder: Continue Toviaz  4.  DVT prophylaxis: SCDs  5.  GI prophylaxis:  Pepcid     All the records are reviewed and case discussed with Care Management/Social Workerr. Management plans discussed with the patient, family and they are in agreement.  CODE STATUS: dnr  TOTAL TIME TAKING CARE OF THIS PATIENT: 34  minutes.   POSSIBLE D/C IN 1-2 DAYS, DEPENDING ON CLINICAL CONDITION.  Note: This dictation was prepared with Dragon dictation along with smaller phrase technology. Any transcriptional errors that result from this process are unintentional.   Ramonita Lab M.D on 03/27/2018 at 9:24 PM  Between 7am to 6pm -  Pager - [phone] After 6pm go to [email or web address] - password [email or web address]  [facility]  Office  [phone]  CC: Primary care physician; System, Pcp Not In

## 2018-03-27 NOTE — Care Management Obs Status (Signed)
MEDICARE OBSERVATION STATUS NOTIFICATION   Patient Details  Name: Lajuanna Gennette MRN: [id number] Date of Birth: [birth year]   Medicare Observation Status Notification Given:  Yes  Patient not capable of signing letter at this point due to lethargy and AMS. RNCM spoke with daughter Autumn Patty [phone] who is POA. She understands this letter and asks that I sign it with an "X". DAughter will be at bedside later today.   Raquon Milledge A, RN 03/27/2018, 9:54 AM

## 2018-03-27 NOTE — Consult Note (Signed)
Cephas Darby, MD [address]  [place]  [place], [place] [address]  Main: [phone]  Fax: [phone] Pager: [phone]   Consultation  Referring Provider:     No ref. provider found Primary Care Physician:  System, Pcp Not In Primary Gastroenterologist:  Dr. Sherri Sear         Reason for Consultation:     Anemia, bleeding from the ileostomy site  Date of Admission:  03/26/2018 Date of Consultation:  03/27/2018         HPI:   Adrienne Patel is a 77 y.o. female with history of Crohn's disease, status post total proctocolectomy and end ileostomy in 1991, with no evidence of active Crohn's disease and not on any medications, history of multiple sclerosis is sent from nursing home due to concern for bloody output from the ileostomy. Over the past 1 month, her hemoglobin has been gradually declining. Baseline hemoglobin is between 13 and 15, slowly down trending since 03/12/18 from 12.2 to 9.5 on 03/15/2018, normal MCV based on the labs from [facility]. Hemoglobin on admission today is 9.3, MCV 100.4 with thrombocytosis. Patient was told that the ostomy output was maroon-colored. When I saw her today, the ostomy output is brown liquid stool with some solid food particles. She also has history of chronic kidney disease and her BUN/creatinine are in fact lower than her baseline She also has history of Billroth II gastrojejunostomy Patient had multiple endoscopies and upper endoscopies done in 2014 and 2015 for High ostomy output and weight loss. She also has history of low pancreatic fecal elastase <50 on 02/27/2013 at [facility] and was supposed to be on Creon. History of small intestinal bacterial overgrowth, was treated with intermittent Flagyl. Celiac serologies were negative  Patient had a CT A/P without contra was found to have thickened appearance of the proximal  Gastrojejunal anastomosis  NSAIDs: none  Antiplts/Anticoagulants/Anti thrombotics: none  GI Procedures: ileoscopy 07/2013  at [facility] Normal ileum Diagnosis: A: Small bowel, jejunum, biopsy  - Small bowel mucosa with preserved villous morphology - No significant pathologic abnormalities  EGD 07/2013 at [facility] - Normal esophagus.  - Patent Billroth II gastrojejunostomy was found.  - Normal examined jejunal limbs. Biopsied.  Ileoscopy 03/02/2013 Findings:  The area at 3 cm proximal to the stoma contained 2 two-three mm ulcers.   No bleeding was present. Biopsies were taken with a cold forceps for   histology.  The remainder of the exam in the terminal ileum was normal. Random   biopsies were taken for histology.  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  Evidence of a Billroth II anastomosis was found in the mid-stomach. The   anastomosis was characterized by healthy appearing mucosa, and was   widely patent. The stomach was otherwise unremarkable.  The examined jejunum (both the alimentary and biliopancreatic limbs) was   normal.  Diagnosis: A:Stomach, random, biopsy. - No significant pathologic abnormality.  B:Small intestine, jejunum, random, biopsy. - No significant pathologic abnormality. - No villus abnormalities or parasites identified.  Past Medical History:  Diagnosis Date  . CKD (chronic kidney disease)   . Crohn's disease ([place])   . Headache   . MS (  multiple sclerosis) ([place])   . Osteoporosis   . Overactive bladder     Past Surgical History:  Procedure Laterality  Date  . ABDOMINAL HYSTERECTOMY    . ankle left fracture    . DDD     cervical  . FRACTURE SURGERY    . ILEOSTOMY     1982  . TONSILLECTOMY      Prior to Admission medications   Medication Sig Start Date End Date Taking? Authorizing Provider  acetaminophen (TYLENOL) 500 MG tablet Take 1,000 mg by mouth every 8 (eight) hours.   Yes [provider]  amphetamine-dextroamphetamine (ADDERALL XR) 15 MG 24 hr capsule Take 30 mg by mouth every morning.   Yes [provider]  Cholecalciferol (VITAMIN D3) POWD Take 800 Units by mouth daily.   Yes [provider]  cyanocobalamin (,VITAMIN B-12,) 1000 MCG/ML injection Inject 1,000 mcg into the muscle every 30 (thirty) days.   Yes [provider]  escitalopram (LEXAPRO) 20 MG tablet Take 20 mg by mouth daily.   Yes [provider]  fesoterodine (TOVIAZ) 4 MG TB24 tablet Take 4 mg by mouth daily.   Yes [provider]  magnesium gluconate (MAGONATE) 500 MG tablet Take 500 mg by mouth 2 (two) times daily.   Yes [provider]  Multiple Vitamin (MULTIVITAMIN) capsule Take 2 capsules by mouth daily.   Yes [provider]  pantoprazole (PROTONIX) 40 MG tablet Take 40 mg by mouth daily.   Yes [provider]  tuberculin 5 UNIT/0.1ML injection Inject 5 Units into the skin every 7 (seven) days. 03/15/18 03/29/18 Yes [provider]    History reviewed. No pertinent family history.   Social History   Tobacco Use  . Smoking status: Former Smoker    Last attempt to quit: 03/27/1986    Years since quitting: 32.0  . Smokeless tobacco: Never Used  Substance Use Topics  . Alcohol use: Not Currently  . Drug use: Yes    Types: Marijuana    Comment: "Years ago"    Allergies as of 03/26/2018  . (Not on File)    Review of Systems:    All systems reviewed and negative except where noted in HPI.   Physical Exam:  Vital signs in last 24 hours: Temp:  [97.7 F (36.5  C)-98.2 F (36.8 C)] 98.2 F (36.8 C) (06/02 1157) Pulse Rate:  [78-104] 99 (06/02 1157) Resp:  [16-22] 16 (06/02 1157) BP: (104-136)/(65-78) 104/65 (06/02 1157) SpO2:  [97 %-100 %] 100 % (06/02 1157) Weight:  [145 lb (65.8 kg)] 145 lb (65.8 kg) (06/02 0000) Last BM Date: 03/27/18 General:   Ill-appearing, malnourished Head:  Normocephalic and atraumatic. Eyes:   No icterus.   Conjunctiva pale. PERRLA. Ears:  Normal auditory acuity. Neck:  Supple; no masses or thyroidomegaly Lungs: Respirations even and unlabored. Lungs clear to auscultation bilaterally.   No wheezes, crackles, or rhonchi.  Heart:  Regular rate and rhythm;  Without murmur, clicks, rubs or gallops Abdomen:  Soft, nondistended, mild midline tenderness over the midline vertical scar. Normal bowel sounds. No appreciable masses or hepatomegaly.  No rebound or guarding. Ileostomy in the right mid quadrant, liquid brown stool with solid food particles, healthy-appearing stomal mucosa with no stigmata of recent bleeding, stoma is patent Rectal:  n/a Msk:  Symmetrical without gross deformities.  Generalized weakness  Extremities:  Without edema, cyanosis or clubbing. Neurologic:  Alert and oriented x3;  grossly normal neurologically. Skin:  Intact without significant lesions  or rashes. Cervical Nodes:  No significant cervical adenopathy. Psych:  Alert and cooperative. Normal affect.  LAB RESULTS: CBC Latest Ref Rng & Units 03/27/2018  WBC 3.6 - 11.0 K/uL 9.7  Hemoglobin 12.0 - 16.0 g/dL 9.3(L)  Hematocrit 35.0 - 47.0 % 28.6(L)  Platelets 150 - 440 K/uL 502(H)    BMET BMP Latest Ref Rng & Units 03/27/2018  Glucose 65 - 99 mg/dL 100(H)  BUN 6 - 20 mg/dL 38(H)  Creatinine 0.44 - 1.00 mg/dL 2.14(H)  Sodium 135 - 145 mmol/L 143  Potassium 3.5 - 5.1 mmol/L 3.3(L)  Chloride 101 - 111 mmol/L 116(H)  CO2 22 - 32 mmol/L 19(L)  Calcium 8.9 - 10.3 mg/dL 8.9    LFT Hepatic Function Latest Ref Rng & Units 03/27/2018  Total Protein  6.5 - 8.1 g/dL 6.4(L)  Albumin 3.5 - 5.0 g/dL 3.2(L)  AST 15 - 41 U/L 27  ALT 14 - 54 U/L 11(L)  Alk Phosphatase 38 - 126 U/L 59  Total Bilirubin 0.3 - 1.2 mg/dL 0.4     STUDIES: Ct Abdomen Pelvis Wo Contrast  Result Date: 03/27/2018 CLINICAL DATA:  Pain and bleeding via ileostomy site. History of Crohn's disease. EXAM: CT ABDOMEN AND PELVIS WITHOUT CONTRAST TECHNIQUE: Multidetector CT imaging of the abdomen and pelvis was performed following the standard protocol without IV contrast. Enteric contrast administered. COMPARISON:  None. FINDINGS: LOWER CHEST: Minimal atelectasis/scarring. The visualized heart size is normal. Partially imaged coronary artery calcification. No pericardial effusion. HEPATOBILIARY: Multiple mildly dense gallstones with mild gallbladder wall thickening. Common bile duct is 15 mm in transaxial dimension. No identified choledocholithiasis. PANCREAS: Normal. SPLEEN: Normal. ADRENALS/URINARY TRACT: Mildly low-lying LEFT kidney with moderate hydroureteronephrosis. Mildly atrophic LEFT kidney. 1 x 2 cm LEFT renal pelvis calculus. Punctate nonobstructing RIGHT nephrolithiasis. Limited assessment for renal masses by nonenhanced CT. Subcentimeter RIGHT upper pole dense exophytic probable hemorrhagic cyst. The unopacified ureters are normal in course and caliber. Urinary bladder is partially distended and unremarkable. Normal adrenal glands. STOMACH/BOWEL: Surgical clips about the GE junction compatible with fundoplication. Status post gastro jejunostomy with thickened appearance of proximal anastomosis. Contrast in the biliary loop and feeding loops. Contrast to the level of the ileostomy and within bag. Status post colectomy. VASCULAR/LYMPHATIC: Aortoiliac vessels are normal in course and caliber. Moderate calcific atherosclerosis. No lymphadenopathy by CT size criteria. REPRODUCTIVE: Status post hysterectomy. OTHER: No intraperitoneal free fluid or free air. MUSCULOSKELETAL: Non-acute.  Thoracolumbar levoscoliosis. Osteopenia. Severe L2-3 and L3-4 degenerative discs. Anterior abdominal wall scarring. IMPRESSION: 1. Cholelithiasis and suspected acute cholecystitis. Biliary dilatation without identified choledocholithiasis. 2. Age indeterminate thickened gastrojejunostomy anastomosis, possibly inflammatory without bowel obstruction. Status post colectomy. 3. Moderate LEFT hydroureteronephrosis, UVJ stricture or mass possible. 1 x 2 cm LEFT nephrolithiasis. Electronically Signed   By: Elon Alas M.D.   On: 03/27/2018 03:34   Dg Chest Port 1 View  Result Date: 03/27/2018 CLINICAL DATA:  Acute onset of bleeding and pain about the ileostomy site. EXAM: PORTABLE CHEST 1 VIEW COMPARISON:  None. FINDINGS: The lungs are well-aerated. Peribronchial thickening is noted. Vague right midlung opacity is nonspecific; though it could reflect scarring, pneumonia cannot be excluded. There is no evidence of pleural effusion or pneumothorax. The cardiomediastinal silhouette is within normal limits. No acute osseous abnormalities are seen. Postoperative change is noted about the gastroesophageal junction. Cervical spinal fusion hardware is noted. IMPRESSION: Peribronchial thickening. Vague right midlung opacity is nonspecific. Though it could reflect scarring, pneumonia cannot be excluded. Followup PA and lateral chest  X-ray is recommended in 3-4 weeks following trial of antibiotic therapy (if the patient has symptoms of pneumonia) to ensure resolution and exclude underlying malignancy. Alternatively, CT could be considered to exclude underlying mass. Electronically Signed   By: Garald Balding M.D.   On: 03/27/2018 00:46      Impression / Plan:   Adrienne Patel is a 77 y.o. Caucasian female with history of multiple sclerosis, Crohn's disease, status post total proctocolectomy and end ileostomy, Billroth II gastrojejunal anastomosis presents with progressively worsening anemia for the last 3 weeks and  bloody ileostomy output. She also has exocrine pancreatic insufficiency, small bowel diverticula with history of bacterial overgrowth. She has macrocytic anemia. Her anemia is most likely secondary to malabsorption in the setting of upper GI and small bowel surgery. There was no evidence of bleeding from the ileostomy on my exam today.  - recommend workup of macrocytic anemia - check ferritin, iron studies, B12, folate, zinc, copper levels - start Creon 36K units 2 capsules with each meal and one with snack - Defer endoscopic evaluation pending above workup as patient is not actively bleeding at this time - Resume diet, ensure 3 times a day - nutrition consult   Thank you for involving me in the care of this patient.  Dr. Vicente Males will cover from tomorrow    LOS: 0 days   Sherri Sear, MD  03/27/2018, 3:51 PM   Note: This dictation was prepared with Dragon dictation along with smaller phrase technology. Any transcriptional errors that result from this process are unintentional.

## 2018-03-27 NOTE — H&P (Addendum)
Adrienne Patel is an 77 y.o. female.   Chief Complaint: Ileostomy bleeding HPI: The patient with past medical history sniffing and for CKD, Crohn's disease and multiple sclerosis presents to the emergency department from her nursing home with bleeding from her ileostomy site.  The patient states that she believed she had bleeding from the adhesive area around her ileostomy bag when the nursing staff changed it.  However in the emergency department the treatment team did not observe any trauma to the skin.  However when the ileostomy bag was removed the patient did have melanotic stool in place.  The patient admits to some abdominal pain but denies nausea.  She appears to also have some element of dementia which makes her history is somewhat less reliable.  Due to her GI bleeding the emergency department staff called the hospitalist service for admission.  Past Medical History:  Diagnosis Date  . CKD (chronic kidney disease)   . Crohn's disease ([place])   . MS (multiple sclerosis) ([place])   . Osteoporosis   . Overactive bladder     Past Surgical History:  Procedure Laterality Date  . ILEOSTOMY      History reviewed. No pertinent family history. Unknown; cannot reach family by phone  Social History:  has no tobacco, alcohol, and drug history on file.  Allergies: No Known Allergies  Prior to Admission medications   Medication Sig Start Date End Date Taking? Authorizing Provider  acetaminophen (TYLENOL) 500 MG tablet Take 1,000 mg by mouth every 8 (eight) hours.   Yes [provider]  amphetamine-dextroamphetamine (ADDERALL XR) 15 MG 24 hr capsule Take 30 mg by mouth every morning.   Yes [provider]  Cholecalciferol (VITAMIN D3) POWD Take 800 Units by mouth daily.   Yes [provider]  cyanocobalamin (,VITAMIN B-12,) 1000 MCG/ML injection Inject 1,000 mcg into the muscle every 30 (thirty) days.   Yes [provider]  escitalopram (LEXAPRO) 20 MG tablet Take  20 mg by mouth daily.   Yes [provider]  fesoterodine (TOVIAZ) 4 MG TB24 tablet Take 4 mg by mouth daily.   Yes [provider]  magnesium gluconate (MAGONATE) 500 MG tablet Take 500 mg by mouth 2 (two) times daily.   Yes [provider]  Multiple Vitamin (MULTIVITAMIN) capsule Take 2 capsules by mouth daily.   Yes [provider]  pantoprazole (PROTONIX) 40 MG tablet Take 40 mg by mouth daily.   Yes [provider]  tuberculin 5 UNIT/0.1ML injection Inject 5 Units into the skin every 7 (seven) days. 03/15/18 03/29/18 Yes [provider]     Results for orders placed or performed during the hospital encounter of 03/26/18 (from the past 48 hour(s))  CBC with Differential     Status: Abnormal   Collection Time: 03/27/18 12:34 AM  Result Value Ref Range   WBC 9.7 3.6 - 11.0 K/uL   RBC 2.85 (L) 3.80 - 5.20 MIL/uL   Hemoglobin 9.3 (L) 12.0 - 16.0 g/dL   HCT 28.6 (L) 35.0 - 47.0 %   MCV 100.4 (H) 80.0 - 100.0 fL   MCH 32.6 26.0 - 34.0 pg   MCHC 32.5 32.0 - 36.0 g/dL   RDW 19.2 (H) 11.5 - 14.5 %   Platelets 502 (H) 150 - 440 K/uL   Neutrophils Relative % 78 %   Neutro Abs 7.6 (H) 1.4 - 6.5 K/uL   Lymphocytes Relative 15 %   Lymphs Abs 1.5 1.0 - 3.6 K/uL  Monocytes Relative 5 %   Monocytes Absolute 0.5 0.2 - 0.9 K/uL   Eosinophils Relative 1 %   Eosinophils Absolute 0.1 0 - 0.7 K/uL   Basophils Relative 1 %   Basophils Absolute 0.1 0 - 0.1 K/uL    Comment: Performed at [facility], [address] [place], [place] [address]  Comprehensive metabolic panel     Status: Abnormal   Collection Time: 03/27/18 12:34 AM  Result Value Ref Range   Sodium 143 135 - 145 mmol/L   Potassium 3.3 (L) 3.5 - 5.1 mmol/L   Chloride 116 (H) 101 - 111 mmol/L   CO2 19 (L) 22 - 32 mmol/L   Glucose, Bld 100 (H) 65 - 99 mg/dL   BUN 38 (H) 6 - 20 mg/dL   Creatinine, Ser 2.14 (H) 0.44 - 1.00 mg/dL   Calcium 8.9 8.9 - 10.3 mg/dL   Total Protein  6.4 (L) 6.5 - 8.1 g/dL   Albumin 3.2 (L) 3.5 - 5.0 g/dL   AST 27 15 - 41 U/L   ALT 11 (L) 14 - 54 U/L   Alkaline Phosphatase 59 38 - 126 U/L   Total Bilirubin 0.4 0.3 - 1.2 mg/dL   GFR calc non Af Amer 22 (L) >60 mL/min   GFR calc Af Amer 25 (L) >60 mL/min    Comment: (NOTE) The eGFR has been calculated using the CKD EPI equation. This calculation has not been validated in all clinical situations. eGFR's persistently <60 mL/min signify possible Chronic Kidney Disease.    Anion gap 8 5 - 15    Comment: Performed at [facility], [address] [place], [place] [address]  Troponin I     Status: None   Collection Time: 03/27/18 12:34 AM  Result Value Ref Range   Troponin I <0.03 <0.03 ng/mL    Comment: Performed at [facility], [address] [place], [place] [address]  Lipase, blood     Status: Abnormal   Collection Time: 03/27/18 12:34 AM  Result Value Ref Range   Lipase 60 (H) 11 - 51 U/L    Comment: Performed at [facility], [address] [place], [place] [address]  Protime-INR     Status: None   Collection Time: 03/27/18 12:34 AM  Result Value Ref Range   Prothrombin Time 13.0 11.4 - 15.2 seconds   INR 0.99     Comment: Performed at [facility], [address] [place], [place] [address]  Type and screen [place]     Status: None   Collection Time: 03/27/18 12:34 AM  Result Value Ref Range   ABO/RH(D) O NEG    Antibody Screen NEG    Sample Expiration      03/30/2018 Performed at [facility], [address] [place], [place] [address]   Urinalysis, Complete w Microscopic     Status: Abnormal   Collection Time: 03/27/18  2:33 AM  Result Value Ref Range   Color, Urine AMBER (A) YELLOW    Comment: BIOCHEMICALS MAY BE AFFECTED BY COLOR   APPearance CLOUDY (A) CLEAR   Specific Gravity, Urine 1.021 1.005 - 1.030   pH 5.0 5.0 - 8.0   Glucose, UA NEGATIVE NEGATIVE mg/dL   Hgb urine dipstick  NEGATIVE NEGATIVE   Bilirubin Urine NEGATIVE NEGATIVE   Ketones, ur NEGATIVE NEGATIVE mg/dL   Protein, ur 30 (A) NEGATIVE mg/dL   Nitrite NEGATIVE NEGATIVE   Leukocytes, UA LARGE (A) NEGATIVE  RBC / HPF 6-10 0 - 5 RBC/hpf   WBC, UA >50 (H) 0 - 5 WBC/hpf   Bacteria, UA RARE (A) NONE SEEN   Squamous Epithelial / LPF NONE SEEN 0 - 5   WBC Clumps PRESENT     Comment: Performed at [facility], [address] [place], [place] [address]   Ct Abdomen Pelvis Wo Contrast  Result Date: 03/27/2018 CLINICAL DATA:  Pain and bleeding via ileostomy site. History of Crohn's disease. EXAM: CT ABDOMEN AND PELVIS WITHOUT CONTRAST TECHNIQUE: Multidetector CT imaging of the abdomen and pelvis was performed following the standard protocol without IV contrast. Enteric contrast administered. COMPARISON:  None. FINDINGS: LOWER CHEST: Minimal atelectasis/scarring. The visualized heart size is normal. Partially imaged coronary artery calcification. No pericardial effusion. HEPATOBILIARY: Multiple mildly dense gallstones with mild gallbladder wall thickening. Common bile duct is 15 mm in transaxial dimension. No identified choledocholithiasis. PANCREAS: Normal. SPLEEN: Normal. ADRENALS/URINARY TRACT: Mildly low-lying LEFT kidney with moderate hydroureteronephrosis. Mildly atrophic LEFT kidney. 1 x 2 cm LEFT renal pelvis calculus. Punctate nonobstructing RIGHT nephrolithiasis. Limited assessment for renal masses by nonenhanced CT. Subcentimeter RIGHT upper pole dense exophytic probable hemorrhagic cyst. The unopacified ureters are normal in course and caliber. Urinary bladder is partially distended and unremarkable. Normal adrenal glands. STOMACH/BOWEL: Surgical clips about the GE junction compatible with fundoplication. Status post gastro jejunostomy with thickened appearance of proximal anastomosis. Contrast in the biliary loop and feeding loops. Contrast to the level of the ileostomy and within bag. Status post  colectomy. VASCULAR/LYMPHATIC: Aortoiliac vessels are normal in course and caliber. Moderate calcific atherosclerosis. No lymphadenopathy by CT size criteria. REPRODUCTIVE: Status post hysterectomy. OTHER: No intraperitoneal free fluid or free air. MUSCULOSKELETAL: Non-acute. Thoracolumbar levoscoliosis. Osteopenia. Severe L2-3 and L3-4 degenerative discs. Anterior abdominal wall scarring. IMPRESSION: 1. Cholelithiasis and suspected acute cholecystitis. Biliary dilatation without identified choledocholithiasis. 2. Age indeterminate thickened gastrojejunostomy anastomosis, possibly inflammatory without bowel obstruction. Status post colectomy. 3. Moderate LEFT hydroureteronephrosis, UVJ stricture or mass possible. 1 x 2 cm LEFT nephrolithiasis. Electronically Signed   By: Elon Alas M.D.   On: 03/27/2018 03:34   Dg Chest Port 1 View  Result Date: 03/27/2018 CLINICAL DATA:  Acute onset of bleeding and pain about the ileostomy site. EXAM: PORTABLE CHEST 1 VIEW COMPARISON:  None. FINDINGS: The lungs are well-aerated. Peribronchial thickening is noted. Vague right midlung opacity is nonspecific; though it could reflect scarring, pneumonia cannot be excluded. There is no evidence of pleural effusion or pneumothorax. The cardiomediastinal silhouette is within normal limits. No acute osseous abnormalities are seen. Postoperative change is noted about the gastroesophageal junction. Cervical spinal fusion hardware is noted. IMPRESSION: Peribronchial thickening. Vague right midlung opacity is nonspecific. Though it could reflect scarring, pneumonia cannot be excluded. Followup PA and lateral chest X-ray is recommended in 3-4 weeks following trial of antibiotic therapy (if the patient has symptoms of pneumonia) to ensure resolution and exclude underlying malignancy. Alternatively, CT could be considered to exclude underlying mass. Electronically Signed   By: Garald Balding M.D.   On: 03/27/2018 00:46    Review of  Systems  Constitutional: Negative for chills and fever.  HENT: Negative for sore throat and tinnitus.   Eyes: Negative for blurred vision and redness.  Respiratory: Negative for cough and shortness of breath.   Cardiovascular: Negative for chest pain, palpitations, orthopnea and PND.  Gastrointestinal: Positive for abdominal pain and melena. Negative for diarrhea, nausea and vomiting.  Genitourinary: Negative for dysuria, frequency and urgency.  Musculoskeletal:  Negative for joint pain and myalgias.  Skin: Negative for rash.       No lesions  Neurological: Negative for speech change, focal weakness and weakness.  Endo/Heme/Allergies: Does not bruise/bleed easily.       No temperature intolerance  Psychiatric/Behavioral: Negative for depression and suicidal ideas.    Blood pressure 118/69, pulse 87, temperature 97.7 F (36.5 C), temperature source Oral, resp. rate 20, height 5' 5"  (1.651 m), weight 65.8 kg (145 lb), SpO2 100 %. Physical Exam  Vitals reviewed. Constitutional: She is oriented to person, place, and time. She appears well-developed and well-nourished. No distress.  HENT:  Head: Normocephalic and atraumatic.  Mouth/Throat: Oropharynx is clear and moist.  Eyes: Pupils are equal, round, and reactive to light. Conjunctivae and EOM are normal. No scleral icterus.  Neck: Normal range of motion. Neck supple. No JVD present. No tracheal deviation present. No thyromegaly present.  Cardiovascular: Normal rate, regular rhythm and normal heart sounds. Exam reveals no gallop and no friction rub.  No murmur heard. Respiratory: Effort normal and breath sounds normal.  GI: Soft. Bowel sounds are normal. She exhibits no distension. There is no tenderness.  Genitourinary:  Genitourinary Comments: Deferred  Musculoskeletal: Normal range of motion. She exhibits no edema.  Lymphadenopathy:    She has no cervical adenopathy.  Neurological: She is alert and oriented to person, place, and  time. No cranial nerve deficit. She exhibits normal muscle tone.  Skin: Skin is warm and dry. No rash noted. No erythema.  Psychiatric: She has a normal mood and affect. Her behavior is normal. Judgment and thought content normal.     Assessment/Plan This is a 77 year old female admitted for GI bleeding. 1.  GI bleed: Melena; she has underlying Crohn's disease.  CT of the abdomen is negative for any acute process.  I have consulted gastroenterology.  The patient is n.p.o.  Pepcid IV for now (Protonix on national back order).  Hemodynamically stable. 2.  UTI: Present on admission; continue Rocephin 3.  Overactive bladder: Continue Toviaz 4.  DVT prophylaxis: SCDs 5.  GI prophylaxis: As above The patient is a DNR.  Time spent on admission orders and patient care approximately 45 minutes  Harrie Foreman, MD 03/27/2018, 5:08 AM

## 2018-03-27 NOTE — ED Notes (Signed)
Report finished att, new IV to be established via [place] att

## 2018-03-27 NOTE — Progress Notes (Signed)
Daughter called 2C stating "I just woke up and got the message" She was just at [facility] for severe dehydration and just went to [facility] care. Was using a walker 1 year ago but does not walk. 2 person transfer to the chair. Incontinent of urine and wears pullups all day long.

## 2018-03-27 NOTE — Clinical Social Work Note (Signed)
Clinical Social Work Assessment  Patient Details  Name: Adrienne Patel MRN: [id number] Date of Birth: 12-08-1940  Date of referral:  03/27/18               Reason for consult:  Facility Placement                Permission sought to share information with:  Chartered certified accountant granted to share information::  Yes, Verbal Permission Granted  Name::        Agency::  [facility]  Relationship::     Contact Information:     Housing/Transportation Living arrangements for the past 2 months:  [place], [place] of Information:  Patient, Adult Children, Facility Patient Interpreter Needed:  None Criminal Activity/Legal Involvement Pertinent to Current Situation/Hospitalization:  No - Comment as needed Significant Relationships:  Adult Children, Other Family Members Lives with:  Self Do you feel safe going back to the place where you live?  Yes Need for family participation in patient care:  No (Coment)  Care giving concerns:  The patient admitted from a SNF   Social Worker assessment / plan:  The CSW met with the patient and her daughter, Adrienne Patel, at bedside to discuss discharge planning. The patient admitted from [facility] where she had been for short term rehab; however, the family and the patient do not want her to return due to safety concerns. The CSW explained the referral process, and the family and patient named [facility] as the preference. The CSW explained that if no other options are available, the patient may need to return to her originating SNF. The patient and her family agreed with this plan should no other options become available by discharge, which will most likely be tomorrow. In addition, Adrienne Patel reported that the patient's HCPOA is Adrienne Patel, the patient's grandson. She provided documentation for the patient's chart.  The CSW has sent the referral. The patient's daughter shared that she had been  trying to contact a person in administration at the facility and was met with "hostility." Adrienne Patel gave verbal permission for the CSW to contact the admissions coordinator ar administration and advise that the family would like to discuss the issues. The CSW sent the information to both administration and the admissions coordinator. The CSW will provide bed offers as they are available.  Employment status:  Retired Forensic scientist:  Medicare PT Recommendations:  Not assessed at this time [place] / Referral to community resources:  [place]  Patient/Family's Response to care:  The patient and her daughter thanked the CSW and responded well to the discussion.  Patient/Family's Understanding of and Emotional Response to Diagnosis, Current Treatment, and Prognosis:  The patient and her daughter are in agreement with return to SNF, preferably a different venue, but willing to return to the original venue should no other options present by discharge.  Emotional Assessment Appearance:  Appears stated age Attitude/Demeanor/Rapport:  Engaged, Gracious Affect (typically observed):  Accepting, Appropriate, Pleasant Orientation:  Oriented to Self, Oriented to Place, Oriented to  Time, Oriented to Situation Alcohol / Substance use:  Never Used Psych involvement (Current and /or in the community):  No (Comment)  Discharge Needs  Concerns to be addressed:  Care Coordination, Discharge Planning Concerns Readmission within the last 30 days:  No Current discharge risk:  Chronically ill Barriers to Discharge:  Continued Medical Work up   [employer], LCSW 03/27/2018, 1:11 PM

## 2018-03-28 DIAGNOSIS — K922 Gastrointestinal hemorrhage, unspecified: Secondary | ICD-10-CM | POA: Diagnosis not present

## 2018-03-28 DIAGNOSIS — E43 Unspecified severe protein-calorie malnutrition: Secondary | ICD-10-CM

## 2018-03-28 LAB — URINE CULTURE: Culture: NO GROWTH

## 2018-03-28 LAB — CBC
HCT: 22.3 % — ABNORMAL LOW (ref 35.0–47.0)
Hemoglobin: 7.3 g/dL — ABNORMAL LOW (ref 12.0–16.0)
MCH: 33.8 pg (ref 26.0–34.0)
MCHC: 33 g/dL (ref 32.0–36.0)
MCV: 102.4 fL — AB (ref 80.0–100.0)
Platelets: 318 10*3/uL (ref 150–440)
RBC: 2.18 MIL/uL — AB (ref 3.80–5.20)
RDW: 20 % — ABNORMAL HIGH (ref 11.5–14.5)
WBC: 5.8 10*3/uL (ref 3.6–11.0)

## 2018-03-28 LAB — HEMOGLOBIN AND HEMATOCRIT, BLOOD
HCT: 23 % — ABNORMAL LOW (ref 35.0–47.0)
HEMATOCRIT: 24.3 % — AB (ref 35.0–47.0)
HEMOGLOBIN: 7.7 g/dL — AB (ref 12.0–16.0)
Hemoglobin: 7.5 g/dL — ABNORMAL LOW (ref 12.0–16.0)

## 2018-03-28 LAB — BASIC METABOLIC PANEL
ANION GAP: 6 (ref 5–15)
BUN: 25 mg/dL — AB (ref 6–20)
CALCIUM: 7.7 mg/dL — AB (ref 8.9–10.3)
CO2: 17 mmol/L — ABNORMAL LOW (ref 22–32)
CREATININE: 1.65 mg/dL — AB (ref 0.44–1.00)
Chloride: 123 mmol/L — ABNORMAL HIGH (ref 101–111)
GFR calc Af Amer: 34 mL/min — ABNORMAL LOW (ref >60)
GFR, EST NON AFRICAN AMERICAN: 30 mL/min — AB (ref >60)
Glucose, Bld: 88 mg/dL (ref 65–99)
POTASSIUM: 4.7 mmol/L (ref 3.5–5.1)
Sodium: 146 mmol/L — ABNORMAL HIGH (ref 135–145)

## 2018-03-28 LAB — PREPARE RBC (CROSSMATCH)

## 2018-03-28 LAB — ABO/RH: ABO/RH(D): O NEG

## 2018-03-28 MED ORDER — BOOST / RESOURCE BREEZE PO LIQD CUSTOM
1.0000 | Freq: Three times a day (TID) | ORAL | Status: DC
  Administered 2018-03-28 – 2018-03-30 (×5): 1 via ORAL

## 2018-03-28 MED ORDER — ADULT MULTIVITAMIN W/MINERALS CH: 1.0000 | ORAL_TABLET | Freq: Every day | ORAL | Status: DC

## 2018-03-28 MED ORDER — SODIUM CHLORIDE 0.9 % IV SOLN
INTRAVENOUS | Status: AC
  Administered 2018-03-28 (×2): via INTRAVENOUS
  Administered 2018-03-29 (×2): 1000 mL via INTRAVENOUS

## 2018-03-28 MED ORDER — MORPHINE SULFATE (PF) 2 MG/ML IV SOLN
2.0000 mg | Freq: Once | INTRAVENOUS | Status: AC
  Administered 2018-03-28: 2 mg via INTRAVENOUS
  Filled 2018-03-28: qty 1

## 2018-03-28 MED ORDER — MAGNESIUM CITRATE PO SOLN
1.0000 | Freq: Once | ORAL | Status: AC
  Administered 2018-03-28: 1 via ORAL
  Filled 2018-03-28: qty 296

## 2018-03-28 MED ORDER — SODIUM CHLORIDE 0.9 % IV SOLN: Freq: Once | INTRAVENOUS | Status: DC

## 2018-03-28 MED ORDER — VITAMIN C 500 MG PO TABS
250.0000 mg | ORAL_TABLET | Freq: Two times a day (BID) | ORAL | Status: DC
  Administered 2018-03-28 (×2): 250 mg via ORAL
  Filled 2018-03-28 (×6): qty 0.5

## 2018-03-28 NOTE — Progress Notes (Addendum)
Wyline Mood , MD [address], [place], [place], [address] [address], [address], [place], [place], [address] Phone: [phone]  Fax: [phone]   Adrienne Patel is being followed for GI bleed   Subjective: Denies any overt bleeding , says stool had no blood today    Objective: Vital signs in last 24 hours: Vitals:   03/27/18 0517 03/27/18 1157 03/27/18 2010 03/28/18 0455  BP: 115/67 104/65 (!) 101/53 105/62  Pulse: 86 99 (!) 115 89  Resp: 18 16 18 19   Temp: 98 F (36.7 C) 98.2 F (36.8 C) 98.4 F (36.9 C) 98.1 F (36.7 C)  TempSrc: Oral Oral Oral Oral  SpO2: 100% 100% 98% 100%  Weight:      Height:       Weight change:   Intake/Output Summary (Last 24 hours) at 03/28/2018 1040 Last data filed at 03/28/2018 0900 Gross per 24 hour  Intake 1333 ml  Output 350 ml  Net 983 ml     Exam: Heart:: Regular rate and rhythm, S1S2 present or without murmur or extra heart sounds Lungs: normal, clear to auscultation and clear to auscultation and percussion Abdomen: soft, RLQ ostomy bag, nontender, normal bowel sounds    Micro Results: Recent Results (from the past 240 hour(s))  Urine culture     Status: None   Collection Time: 03/27/18  2:33 AM  Result Value Ref Range Status   Specimen Description   Final    URINE, CATHETERIZED Performed at [facility], [address] [place], [place] [address]    Special Requests   Final    NONE Performed at [facility], [address], [place], [place] [address]    Culture   Final    NO GROWTH Performed at [facility], [address], [place], [place] [address]    Report Status 03/28/2018 FINAL  Final   Studies/Results: Ct Abdomen Pelvis Wo Contrast  Result Date: 03/27/2018 CLINICAL DATA:  Pain and bleeding via ileostomy site. History of Crohn's disease. EXAM: CT ABDOMEN AND PELVIS WITHOUT CONTRAST TECHNIQUE: Multidetector CT imaging of the abdomen and pelvis was performed following  the standard protocol without IV contrast. Enteric contrast administered. COMPARISON:  None. FINDINGS: LOWER CHEST: Minimal atelectasis/scarring. The visualized heart size is normal. Partially imaged coronary artery calcification. No pericardial effusion. HEPATOBILIARY: Multiple mildly dense gallstones with mild gallbladder wall thickening. Common bile duct is 15 mm in transaxial dimension. No identified choledocholithiasis. PANCREAS: Normal. SPLEEN: Normal. ADRENALS/URINARY TRACT: Mildly low-lying LEFT kidney with moderate hydroureteronephrosis. Mildly atrophic LEFT kidney. 1 x 2 cm LEFT renal pelvis calculus. Punctate nonobstructing RIGHT nephrolithiasis. Limited assessment for renal masses by nonenhanced CT. Subcentimeter RIGHT upper pole dense exophytic probable hemorrhagic cyst. The unopacified ureters are normal in course and caliber. Urinary bladder is partially distended and unremarkable. Normal adrenal glands. STOMACH/BOWEL: Surgical clips about the GE junction compatible with fundoplication. Status post gastro jejunostomy with thickened appearance of proximal anastomosis. Contrast in the biliary loop and feeding loops. Contrast to the level of the ileostomy and within bag. Status post colectomy. VASCULAR/LYMPHATIC: Aortoiliac vessels are normal in course and caliber. Moderate calcific atherosclerosis. No lymphadenopathy by CT size criteria. REPRODUCTIVE: Status post hysterectomy. OTHER: No intraperitoneal free fluid or free air. MUSCULOSKELETAL: Non-acute. Thoracolumbar levoscoliosis. Osteopenia. Severe L2-3 and L3-4 degenerative discs. Anterior abdominal wall scarring. IMPRESSION: 1. Cholelithiasis and suspected acute cholecystitis. Biliary dilatation without identified choledocholithiasis. 2. Age indeterminate thickened gastrojejunostomy anastomosis, possibly inflammatory without bowel obstruction. Status post  colectomy. 3. Moderate LEFT hydroureteronephrosis, UVJ stricture or mass possible. 1 x 2 cm  LEFT nephrolithiasis. Electronically Signed   By: Awilda Metro M.D.   On: 03/27/2018 03:34   Dg Chest Port 1 View  Result Date: 03/27/2018 CLINICAL DATA:  Acute onset of bleeding and pain about the ileostomy site. EXAM: PORTABLE CHEST 1 VIEW COMPARISON:  None. FINDINGS: The lungs are well-aerated. Peribronchial thickening is noted. Vague right midlung opacity is nonspecific; though it could reflect scarring, pneumonia cannot be excluded. There is no evidence of pleural effusion or pneumothorax. The cardiomediastinal silhouette is within normal limits. No acute osseous abnormalities are seen. Postoperative change is noted about the gastroesophageal junction. Cervical spinal fusion hardware is noted. IMPRESSION: Peribronchial thickening. Vague right midlung opacity is nonspecific. Though it could reflect scarring, pneumonia cannot be excluded. Followup PA and lateral chest X-ray is recommended in 3-4 weeks following trial of antibiotic therapy (if the patient has symptoms of pneumonia) to ensure resolution and exclude underlying malignancy. Alternatively, CT could be considered to exclude underlying mass. Electronically Signed   By: Roanna Raider M.D.   On: 03/27/2018 00:46   Medications: I have reviewed the patient's current medications. Scheduled Meds: . docusate sodium  100 mg Oral BID  . feeding supplement (ENSURE ENLIVE)  237 mL Oral TID BM  . lipase/protease/amylase  72,000 Units Oral TID WC   Continuous Infusions: . 0.9 % NaCl with KCl 20 mEq / L 100 mL/hr at 03/27/18 2139  . cefTRIAXone (ROCEPHIN)  IV Stopped (03/27/18 2210)  . famotidine (PEPCID) IV 20 mg (03/28/18 0844)   PRN Meds:.acetaminophen **OR** acetaminophen, ondansetron **OR** ondansetron (ZOFRAN) IV, traMADol  CBC Latest Ref Rng & Units 03/28/2018 03/27/2018 03/27/2018  WBC 3.6 - 11.0 K/uL 5.8 - 9.7  Hemoglobin 12.0 - 16.0 g/dL 7.3(L) 7.1(L) 9.3(L)  Hematocrit 35.0 - 47.0 % 22.3(L) 22.0(L) 28.6(L)  Platelets 150 - 440 K/uL 318  - 502(H)    Assessment: Active Problems:   GIB (gastrointestinal bleeding)  Adrienne Patel 77 y.o. female with Crohns disease , s/p total proctocolectomy and end ileostomy, Bilroth II GJ anastomosis. Admitted with worsening anemia  And some concerns for bleeding into the bag.   Plan: 1. EGD+ ileoscopy tomorrow. If negative may need a capsule study ( would need SBFT prior to r/o strictures). Suggest a dose of magnesium sulfate this evening  2. Monitor CBC and transfuse as needed   I have discussed alternative options, risks & benefits,  which include, but are not limited to, bleeding, infection, perforation,respiratory complication & drug reaction.  The patient agrees with this plan & written consent will be obtained.      LOS: 0 days   Wyline Mood, MD 03/28/2018, 10:40 AM

## 2018-03-28 NOTE — Progress Notes (Signed)
Initial Nutrition Assessment  DOCUMENTATION CODES:   Severe malnutrition in context of chronic illness  INTERVENTION:   Ensure Enlive po BID, each supplement provides 350 kcal and 20 grams of protein  Boost Breeze po TID, each supplement provides 250 kcal and 9 grams of protein  Carnation Instant Breakfast w/ skim milk BID- each packet provides 130kcal and 5g protein   Liberalize diet  MVI daily   Vitamin C 282m po BID  NUTRITION DIAGNOSIS:   Severe Malnutrition related to chronic illness as evidenced by mild fat depletion, moderate to severe muscle depletion, 15 percent weight loss in 6 months.  GOAL:   Patient will meet greater than or equal to 90% of their needs  MONITOR:   PO intake, Supplement acceptance, Labs, Weight trends, Skin, I & O's  REASON FOR ASSESSMENT:   Consult Assessment of nutrition requirement/status  ASSESSMENT:   77y.o. female with history of multiple sclerosis, EPI, Crohn's disease, s/p total proctocolectomy and end ileostomy, s/p Billroth II gastrojejunal anastomosis, CKD, h/o bacterial overgrowth presents with progressively worsening anemia for the last 3 weeks and bloody ileostomy output.    Met with pt in room today. Pt reports chronic poor appetite and oral intake over the past 6 months. Pt is currently living at [facility] pt reports that she hates the food there and does not eat much. Pt does not drink any supplements and reports that supplements high in fat "run right through me". Pt currently on Creon. Pt did drink some Ensure this morning but is afraid to drink more than a few sips. Pt prefers Boost Breeze or carnation instant breakfast with skim milk. Pt reports that she did take a daily MVI prior to going to rehab. Per chart, pt has lost 22lbs(15%) over the past 6 months; this is severe weight loss. Pt with h/o multiple GI surgeries including Billroth II in ~1982. Pt at high risk for nutrient deficiencies r/t her gastric  surgeries and avoidance of fruits and vegetables in fear of having a Crohn's flare. Recommend checking thiamine, B12, folate, iron, calcium, zinc, copper, and vitamins D, A, E, & K yearly. Pt noted to have ecchymosis and easy bruising. Pt also reports blisters around her mouth and tongue and recent depression. Suspect pt with low B and C vitamin levels. Recommend MVI and vitamin C daily. RD will order supplements and liberalize diet. Pt ate 85% of her breakfast this morning that included eggs and toast. Per GI note, "EGD+ ileoscopy tomorrow. If negative may need a capsule study (would need SBFT prior to r/o strictures)."  Medications reviewed and include: colace, creon, NaCl with KCl @100ml /hr, ceftriaxone, pepcid, tramadol  Labs reviewed: Na 146(H), CL 123(H), BUN 25(H), creat 1.65(H), Ca 7.7(H) Lipase- 60(H)- 6/2 Hgb 7.3(L), Hct 22.3(L) Iron 42, TIBC 220, ferritin 526, folate 40, B12 1397- 6/2  NUTRITION - FOCUSED PHYSICAL EXAM:    Most Recent Value  Orbital Region  Mild depletion  Upper Arm Region  Mild depletion  Thoracic and Lumbar Region  Mild depletion  Buccal Region  Mild depletion  Temple Region  Moderate depletion  Clavicle Bone Region  Severe depletion  Clavicle and Acromion Bone Region  Severe depletion  Scapular Bone Region  Severe depletion  Dorsal Hand  Severe depletion  Patellar Region  Moderate depletion  Anterior Thigh Region  Moderate depletion  Posterior Calf Region  Moderate depletion  Edema (RD Assessment)  None  Hair  Reviewed  Eyes  Reviewed  Mouth  Reviewed  Skin  Reviewed  Nails  Reviewed     Diet Order:   Diet Order           Diet Heart Room service appropriate? Yes; Fluid consistency: Thin  Diet effective now         EDUCATION NEEDS:   Education needs have been addressed  Skin:  Skin Assessment: Reviewed RN Assessment(eccymosis )  Last BM:  6/2- type 6- 358m via ostomy   Height:   Ht Readings from Last 1 Encounters:  03/27/18 5' 5"  (1.651  m)    Weight:   Wt Readings from Last 1 Encounters:  03/28/18 123 lb 11.2 oz (56.1 kg)    Ideal Body Weight:  56.8 kg  BMI:  Body mass index is 20.58 kg/m.  Estimated Nutritional Needs:   Kcal:  1400-1600kcal/day   Protein:  73-84g/day   Fluid:  >1.4L/day   CKoleen DistanceMS, RD, LDN Pager #- [phone]#- [phone] Hours Pager: [phone]

## 2018-03-28 NOTE — Progress Notes (Signed)
[facility] - [place] at [facility]   PATIENT NAME: Adrienne Patel    MR#:  [id number]  DATE OF BIRTH:  Mar 05, 1941  SUBJECTIVE:  CHIEF COMPLAINT: Patient is resting comfortably with no complaints.  Did not notice any blood in her ileostomy today  REVIEW OF SYSTEMS:  CONSTITUTIONAL: No fever, fatigue or weakness.  EYES: No blurred or double vision.  EARS, NOSE, AND THROAT: No tinnitus or ear pain.  RESPIRATORY: No cough, shortness of breath, wheezing or hemoptysis.  CARDIOVASCULAR: No chest pain, orthopnea, edema.  GASTROINTESTINAL: No nausea, vomiting, diarrhea or abdominal pain.  GENITOURINARY: No dysuria, hematuria.  ENDOCRINE: No polyuria, nocturia,  HEMATOLOGY: No anemia, easy bruising or bleeding SKIN: No rash or lesion. MUSCULOSKELETAL: No joint pain or arthritis.   NEUROLOGIC: No tingling, numbness, weakness.  PSYCHIATRY: No anxiety or depression.   DRUG ALLERGIES:  No Known Allergies  VITALS:  Blood pressure (!) 103/50, pulse 98, temperature 98.4 F (36.9 C), temperature source Oral, resp. rate 18, height 5\' 5"  (1.651 m), weight 56.1 kg (123 lb 11.2 oz), SpO2 100 %.  PHYSICAL EXAMINATION:  GENERAL:  77 y.o.-year-old patient lying in the bed with no acute distress.  EYES: Pupils equal, round, reactive to light and accommodation. No scleral icterus. Extraocular muscles intact.  HEENT: Head atraumatic, normocephalic. Oropharynx and nasopharynx clear.  NECK:  Supple, no jugular venous distention. No thyroid enlargement, no tenderness.  LUNGS: Normal breath sounds bilaterally, no wheezing, rales,rhonchi or crepitation. No use of accessory muscles of respiration.  CARDIOVASCULAR: S1, S2 normal. No murmurs, rubs, or gallops.  ABDOMEN: Soft, nontender, nondistended. Bowel sounds present.  Ileostomy site intact with brown stool EXTREMITIES: No pedal edema, cyanosis, or clubbing.  NEUROLOGIC: Cranial nerves II through XII are intact. Muscle strength 5/5  in all extremities. Sensation intact. Gait not checked.  PSYCHIATRIC: The patient is alert and oriented x 3.  SKIN: No obvious rash, lesion, or ulcer.    LABORATORY PANEL:   CBC Recent Labs  Lab 03/28/18 0325  WBC 5.8  HGB 7.3*  HCT 22.3*  PLT 318   ------------------------------------------------------------------------------------------------------------------  Chemistries  Recent Labs  Lab 03/27/18 0034 03/28/18 0325  NA 143 146*  K 3.3* 4.7  CL 116* 123*  CO2 19* 17*  GLUCOSE 100* 88  BUN 38* 25*  CREATININE 2.14* 1.65*  CALCIUM 8.9 7.7*  AST 27  --   ALT 11*  --   ALKPHOS 59  --   BILITOT 0.4  --    ------------------------------------------------------------------------------------------------------------------  Cardiac Enzymes Recent Labs  Lab 03/27/18 0034  TROPONINI <0.03   ------------------------------------------------------------------------------------------------------------------  RADIOLOGY:  Ct Abdomen Pelvis Wo Contrast  Result Date: 03/27/2018 CLINICAL DATA:  Pain and bleeding via ileostomy site. History of Crohn's disease. EXAM: CT ABDOMEN AND PELVIS WITHOUT CONTRAST TECHNIQUE: Multidetector CT imaging of the abdomen and pelvis was performed following the standard protocol without IV contrast. Enteric contrast administered. COMPARISON:  None. FINDINGS: LOWER CHEST: Minimal atelectasis/scarring. The visualized heart size is normal. Partially imaged coronary artery calcification. No pericardial effusion. HEPATOBILIARY: Multiple mildly dense gallstones with mild gallbladder wall thickening. Common bile duct is 15 mm in transaxial dimension. No identified choledocholithiasis. PANCREAS: Normal. SPLEEN: Normal. ADRENALS/URINARY TRACT: Mildly low-lying LEFT kidney with moderate hydroureteronephrosis. Mildly atrophic LEFT kidney. 1 x 2 cm LEFT renal pelvis calculus. Punctate nonobstructing RIGHT nephrolithiasis. Limited assessment for renal masses by  nonenhanced CT. Subcentimeter RIGHT upper pole dense exophytic probable hemorrhagic cyst. The unopacified ureters are normal in course and caliber. Urinary  bladder is partially distended and unremarkable. Normal adrenal glands. STOMACH/BOWEL: Surgical clips about the GE junction compatible with fundoplication. Status post gastro jejunostomy with thickened appearance of proximal anastomosis. Contrast in the biliary loop and feeding loops. Contrast to the level of the ileostomy and within bag. Status post colectomy. VASCULAR/LYMPHATIC: Aortoiliac vessels are normal in course and caliber. Moderate calcific atherosclerosis. No lymphadenopathy by CT size criteria. REPRODUCTIVE: Status post hysterectomy. OTHER: No intraperitoneal free fluid or free air. MUSCULOSKELETAL: Non-acute. Thoracolumbar levoscoliosis. Osteopenia. Severe L2-3 and L3-4 degenerative discs. Anterior abdominal wall scarring. IMPRESSION: 1. Cholelithiasis and suspected acute cholecystitis. Biliary dilatation without identified choledocholithiasis. 2. Age indeterminate thickened gastrojejunostomy anastomosis, possibly inflammatory without bowel obstruction. Status post colectomy. 3. Moderate LEFT hydroureteronephrosis, UVJ stricture or mass possible. 1 x 2 cm LEFT nephrolithiasis. Electronically Signed   By: Awilda Metro M.D.   On: 03/27/2018 03:34   Dg Chest Port 1 View  Result Date: 03/27/2018 CLINICAL DATA:  Acute onset of bleeding and pain about the ileostomy site. EXAM: PORTABLE CHEST 1 VIEW COMPARISON:  None. FINDINGS: The lungs are well-aerated. Peribronchial thickening is noted. Vague right midlung opacity is nonspecific; though it could reflect scarring, pneumonia cannot be excluded. There is no evidence of pleural effusion or pneumothorax. The cardiomediastinal silhouette is within normal limits. No acute osseous abnormalities are seen. Postoperative change is noted about the gastroesophageal junction. Cervical spinal fusion hardware is  noted. IMPRESSION: Peribronchial thickening. Vague right midlung opacity is nonspecific. Though it could reflect scarring, pneumonia cannot be excluded. Followup PA and lateral chest X-ray is recommended in 3-4 weeks following trial of antibiotic therapy (if the patient has symptoms of pneumonia) to ensure resolution and exclude underlying malignancy. Alternatively, CT could be considered to exclude underlying mass. Electronically Signed   By: Roanna Raider M.D.   On: 03/27/2018 00:46    EKG:   Orders placed or performed during the hospital encounter of 03/26/18  . ED EKG  . ED EKG  . EKG 12-Lead  . EKG 12-Lead    ASSESSMENT AND PLAN:    This is a 77 year old female admitted for GI bleeding. 1.  GI bleed: Melena; she has underlying Crohn's disease.  Patient hemoglobin is glad gradually declining over the past several months. Monitor hemoglobin and hematocrit closely  CT of the abdomen is negative for any acute process. Patient is seen by GI, recommending work-up for macrocytic anemia including ferritin, iron studies, B12, folate, zinc and copper levels EGD  Plus ileoscopy - in am , if negative he might consider doing capsule study Hemoglobin 9.3-7.3, will transfuse 1 unit of blood Patient is started on Creon and nutrition supplements Ensure 3 times a day Resume diet   Pepcid IV for now (Protonix on national back order).    hemodynamically stable.  2.  UTI: Present on admission; discontinue Rocephin, urine culture and sensitivity - no growth   3.  Overactive bladder: Continue Toviaz  4.  DVT prophylaxis: SCDs  5.  GI prophylaxis:  Pepcid     All the records are reviewed and case discussed with Care Management/Social Workerr. Management plans discussed with the patient, family and they are in agreement.  CODE STATUS: dnr  TOTAL TIME TAKING CARE OF THIS PATIENT: 34  minutes.   POSSIBLE D/C IN 1-2 DAYS, DEPENDING ON CLINICAL CONDITION.  Note: This dictation was prepared  with Dragon dictation along with smaller phrase technology. Any transcriptional errors that result from this process are unintentional.   Ramonita Lab M.D on 03/28/2018  at 3:34 PM  Between 7am to 6pm - Pager - [phone] After 6pm go to [email or web address] - password [email or web address]  [facility]  Office  [phone]  CC: Primary care physician; System, Pcp Not In

## 2018-03-28 NOTE — Clinical Social Work Note (Signed)
[facility] has offered to take patient and patient is aware. York Spaniel MSW,LCSW [phone]

## 2018-03-29 ENCOUNTER — Inpatient Hospital Stay: Payer: Medicare Other | Admitting: Anesthesiology

## 2018-03-29 ENCOUNTER — Encounter
Admission: EM | Disposition: A | Discharge status: Skilled Nursing Facility | Payer: Self-pay | Source: Home / Self Care | Attending: Internal Medicine

## 2018-03-29 ENCOUNTER — Encounter: Payer: Self-pay | Admitting: Anesthesiology

## 2018-03-29 DIAGNOSIS — N200 Calculus of kidney: Secondary | ICD-10-CM | POA: Diagnosis present

## 2018-03-29 DIAGNOSIS — Z90710 Acquired absence of both cervix and uterus: Secondary | ICD-10-CM | POA: Diagnosis not present

## 2018-03-29 DIAGNOSIS — Z79899 Other long term (current) drug therapy: Secondary | ICD-10-CM | POA: Diagnosis not present

## 2018-03-29 DIAGNOSIS — K922 Gastrointestinal hemorrhage, unspecified: Secondary | ICD-10-CM | POA: Diagnosis present

## 2018-03-29 DIAGNOSIS — M810 Age-related osteoporosis without current pathological fracture: Secondary | ICD-10-CM | POA: Diagnosis present

## 2018-03-29 DIAGNOSIS — D539 Nutritional anemia, unspecified: Secondary | ICD-10-CM | POA: Diagnosis present

## 2018-03-29 DIAGNOSIS — E43 Unspecified severe protein-calorie malnutrition: Secondary | ICD-10-CM | POA: Diagnosis present

## 2018-03-29 DIAGNOSIS — K5090 Crohn's disease, unspecified, without complications: Secondary | ICD-10-CM | POA: Diagnosis present

## 2018-03-29 DIAGNOSIS — K921 Melena: Secondary | ICD-10-CM

## 2018-03-29 DIAGNOSIS — G8929 Other chronic pain: Secondary | ICD-10-CM | POA: Diagnosis present

## 2018-03-29 DIAGNOSIS — N136 Pyonephrosis: Secondary | ICD-10-CM | POA: Diagnosis present

## 2018-03-29 DIAGNOSIS — N3281 Overactive bladder: Secondary | ICD-10-CM | POA: Diagnosis present

## 2018-03-29 DIAGNOSIS — Z87891 Personal history of nicotine dependence: Secondary | ICD-10-CM | POA: Diagnosis not present

## 2018-03-29 DIAGNOSIS — F0390 Unspecified dementia without behavioral disturbance: Secondary | ICD-10-CM | POA: Diagnosis present

## 2018-03-29 DIAGNOSIS — Z6820 Body mass index (BMI) 20.0-20.9, adult: Secondary | ICD-10-CM | POA: Diagnosis not present

## 2018-03-29 DIAGNOSIS — N390 Urinary tract infection, site not specified: Secondary | ICD-10-CM | POA: Diagnosis present

## 2018-03-29 DIAGNOSIS — G35 Multiple sclerosis: Secondary | ICD-10-CM | POA: Diagnosis present

## 2018-03-29 DIAGNOSIS — K219 Gastro-esophageal reflux disease without esophagitis: Secondary | ICD-10-CM | POA: Diagnosis present

## 2018-03-29 DIAGNOSIS — N189 Chronic kidney disease, unspecified: Secondary | ICD-10-CM | POA: Diagnosis present

## 2018-03-29 DIAGNOSIS — Z9049 Acquired absence of other specified parts of digestive tract: Secondary | ICD-10-CM | POA: Diagnosis not present

## 2018-03-29 DIAGNOSIS — Z9842 Cataract extraction status, left eye: Secondary | ICD-10-CM | POA: Diagnosis not present

## 2018-03-29 DIAGNOSIS — N179 Acute kidney failure, unspecified: Secondary | ICD-10-CM | POA: Diagnosis present

## 2018-03-29 DIAGNOSIS — Z9841 Cataract extraction status, right eye: Secondary | ICD-10-CM | POA: Diagnosis not present

## 2018-03-29 DIAGNOSIS — K9411 Enterostomy hemorrhage: Secondary | ICD-10-CM | POA: Diagnosis present

## 2018-03-29 DIAGNOSIS — Z66 Do not resuscitate: Secondary | ICD-10-CM | POA: Diagnosis present

## 2018-03-29 HISTORY — PX: ESOPHAGOGASTRODUODENOSCOPY: SHX5428

## 2018-03-29 HISTORY — PX: ILEOSCOPY: SHX5434

## 2018-03-29 LAB — HEMOGLOBIN AND HEMATOCRIT, BLOOD
HCT: 26.2 % — ABNORMAL LOW (ref 35.0–47.0)
Hemoglobin: 8.6 g/dL — ABNORMAL LOW (ref 12.0–16.0)

## 2018-03-29 LAB — BASIC METABOLIC PANEL
Anion gap: 3 — ABNORMAL LOW (ref 5–15)
BUN: 18 mg/dL (ref 6–20)
CO2: 16 mmol/L — AB (ref 22–32)
CREATININE: 1.33 mg/dL — AB (ref 0.44–1.00)
Calcium: 7.9 mg/dL — ABNORMAL LOW (ref 8.9–10.3)
Chloride: 121 mmol/L — ABNORMAL HIGH (ref 101–111)
GFR calc non Af Amer: 38 mL/min — ABNORMAL LOW (ref >60)
GFR, EST AFRICAN AMERICAN: 44 mL/min — AB (ref >60)
Glucose, Bld: 90 mg/dL (ref 65–99)
Potassium: 4.6 mmol/L (ref 3.5–5.1)
Sodium: 140 mmol/L (ref 135–145)

## 2018-03-29 LAB — CBC
HCT: 26 % — ABNORMAL LOW (ref 35.0–47.0)
Hemoglobin: 8.5 g/dL — ABNORMAL LOW (ref 12.0–16.0)
MCH: 31.9 pg (ref 26.0–34.0)
MCHC: 32.8 g/dL (ref 32.0–36.0)
MCV: 97.3 fL (ref 80.0–100.0)
PLATELETS: 277 10*3/uL (ref 150–440)
RBC: 2.68 MIL/uL — AB (ref 3.80–5.20)
RDW: 25.4 % — ABNORMAL HIGH (ref 11.5–14.5)
WBC: 6.2 10*3/uL (ref 3.6–11.0)

## 2018-03-29 SURGERY — EGD (ESOPHAGOGASTRODUODENOSCOPY)
Anesthesia: General

## 2018-03-29 MED ORDER — LIDOCAINE HCL (CARDIAC) PF 100 MG/5ML IV SOSY
PREFILLED_SYRINGE | INTRAVENOUS | Status: DC | PRN
  Administered 2018-03-29: 50 mg via INTRAVENOUS

## 2018-03-29 MED ORDER — PROPOFOL 10 MG/ML IV BOLUS
INTRAVENOUS | Status: DC | PRN
  Administered 2018-03-29: 50 mg via INTRAVENOUS

## 2018-03-29 MED ORDER — PROPOFOL 500 MG/50ML IV EMUL
INTRAVENOUS | Status: DC | PRN
  Administered 2018-03-29: 55 ug/kg/min via INTRAVENOUS

## 2018-03-29 MED ORDER — PROPOFOL 10 MG/ML IV BOLUS
INTRAVENOUS | Status: AC
  Filled 2018-03-29: qty 20

## 2018-03-29 MED ORDER — SODIUM CHLORIDE 0.9 % IV SOLN: INTRAVENOUS | Status: DC

## 2018-03-29 NOTE — Anesthesia Postprocedure Evaluation (Signed)
Anesthesia Post Note  Patient: Melburn Popper  Procedure(s) Performed: ESOPHAGOGASTRODUODENOSCOPY (EGD) (N/A ) ILEOSCOPY THROUGH STOMA (N/A )  Patient location during evaluation: Endoscopy Anesthesia Type: General Level of consciousness: awake and alert Pain management: pain level controlled Vital Signs Assessment: post-procedure vital signs reviewed and stable Respiratory status: spontaneous breathing, nonlabored ventilation, respiratory function stable and patient connected to nasal cannula oxygen Cardiovascular status: blood pressure returned to baseline and stable Postop Assessment: no apparent nausea or vomiting Anesthetic complications: no     Last Vitals:  Vitals:   03/29/18 1110 03/29/18 1235  BP: 121/70 121/73  Pulse: 91 85  Resp: 19 19  Temp:  36.9 C  SpO2: 100% 100%    Last Pain:  Vitals:   03/29/18 1235  TempSrc: Axillary  PainSc:                  Lenard Simmer

## 2018-03-29 NOTE — Op Note (Signed)
[facility] Patient Name: [name] Procedure Date: 03/29/2018 9:48 AM MRN: [id number] Account #: [id number] Date of Birth: [birth year] Admit Type: Inpatient Age: 77 Room: [facility] ENDO ROOM 1 Gender: Female Note Status: Finalized Procedure:            Ileoscopy Indications:          History of total colectomy, Melena Providers:            Adrienne Mood MD, MD Referring MD:         Adrienne Barman. Chelminski MD, MD (Referring MD) Medicines:            Monitored Anesthesia Care Complications:        No immediate complications. Procedure:            Pre-Anesthesia Assessment:                       - Prior to the procedure, a History and Physical was                        performed, and patient medications, allergies and                        sensitivities were reviewed. The patient's tolerance of                        previous anesthesia was reviewed.                       - The risks and benefits of the procedure and the                        sedation options and risks were discussed with the                        patient. All questions were answered and informed                        consent was obtained.                       - ASA Grade Assessment: III - A patient with severe                        systemic disease.                       After I obtained informed consent, the scope was passed                        under direct vision. Throughout the procedure, the                        patient's blood pressure, pulse, and oxygen saturations                        were monitored continuously. The Endoscope was                        introduced through the anus and advanced to the the  distal ileum. After I obtained informed consent, the                        scope was passed under direct vision. Throughout the                        procedure, the patient's blood pressure, pulse, and                        oxygen saturations  were monitored continuously.The                        ileoscopy was performed with ease. The patient                        tolerated the procedure well. The quality of the bowel                        preparation was adequate. Findings:      Patient is status-post total colectomy with a surgical anastomosis.      The terminal ileum appeared normal. Impression:           - The examined portion of the ileum was normal.                       - No specimens collected. Recommendation:       - Return patient to hospital ward for ongoing care.                       - Advance diet as tolerated today.                       - Monitor CBC, if stable can be discharged and follow                        up with Adrienne Patel in a week to discuss biopsy results                        and further treatment. Procedure Code(s):    --- Professional ---                       [phone], Endoscopic evaluation of small intestinal pouch                        (eg, Kock pouch, ileal reservoir [S or J]); diagnostic,                        including collection of specimen(s) by brushing or                        washing, when performed (separate procedure) Diagnosis Code(s):    --- Professional ---                       Z90.49, Acquired absence of other specified parts of                        digestive tract  K92.1, Melena (includes Hematochezia) CPT copyright 2017 American Medical Association. All rights reserved. The codes documented in this report are preliminary and upon coder review may  be revised to meet current compliance requirements. Adrienne Mood, MD Adrienne Mood MD, MD 03/29/2018 10:18:59 AM This report has been signed electronically. Number of Addenda: 0 Note Initiated On: 03/29/2018 9:48 AM Total Procedure Duration: 0 hours 1 minute 39 seconds       [facility]

## 2018-03-29 NOTE — Anesthesia Preprocedure Evaluation (Signed)
Anesthesia Evaluation  Patient identified by MRN, date of birth, ID band Patient awake    Reviewed: Allergy & Precautions, H&P , NPO status , Patient's Chart, lab work & pertinent test results, reviewed documented beta blocker date and time   Airway Mallampati: I  TM Distance: >3 FB Neck ROM: full    Dental  (+) Upper Dentures, Lower Dentures, Dental Advidsory Given   Pulmonary neg pulmonary ROS, former smoker,           Cardiovascular Exercise Tolerance: Good negative cardio ROS       Neuro/Psych neg Seizures  Neuromuscular disease (MS, no recent flairs that she can remember) negative psych ROS   GI/Hepatic Neg liver ROS, GERD  ,  Endo/Other  negative endocrine ROS  Renal/GU CRFRenal disease  negative genitourinary   Musculoskeletal   Abdominal   Peds  Hematology negative hematology ROS (+)   Anesthesia Other Findings Past Medical History: No date: CKD (chronic kidney disease) No date: Crohn's disease (HCC) No date: Headache No date: MS (multiple sclerosis) (HCC) No date: Osteoporosis No date: Overactive bladder   Reproductive/Obstetrics negative OB ROS                             Anesthesia Physical Anesthesia Plan  ASA: III  Anesthesia Plan: General   Post-op Pain Management:    Induction: Intravenous  PONV Risk Score and Plan: 3 and Propofol infusion  Airway Management Planned: Nasal Cannula  Additional Equipment:   Intra-op Plan:   Post-operative Plan:   Informed Consent: I have reviewed the patients History and Physical, chart, labs and discussed the procedure including the risks, benefits and alternatives for the proposed anesthesia with the patient or authorized representative who has indicated his/her understanding and acceptance.   Dental Advisory Given  Plan Discussed with: Anesthesiologist, CRNA and Surgeon  Anesthesia Plan Comments:          Anesthesia Quick Evaluation

## 2018-03-29 NOTE — Op Note (Signed)
[facility] Gastroenterology Patient Name: Adrienne Patel Procedure Date: 03/29/2018 9:49 AM MRN: [id number] Account #: [id number] Date of Birth: Dec 05, 1940 Admit Type: Inpatient Age: 77 Room: [facility] ENDO ROOM 1 Gender: Female Note Status: Finalized Procedure:            Upper GI endoscopy Indications:          Melena Providers:            Wyline Mood MD, MD Referring MD:         Cornell Barman. Chelminski MD, MD (Referring MD) Medicines:            Monitored Anesthesia Care Complications:        No immediate complications. Procedure:            Pre-Anesthesia Assessment:                       - Prior to the procedure, a History and Physical was                        performed, and patient medications, allergies and                        sensitivities were reviewed. The patient's tolerance of                        previous anesthesia was reviewed.                       - The risks and benefits of the procedure and the                        sedation options and risks were discussed with the                        patient. All questions were answered and informed                        consent was obtained.                       - ASA Grade Assessment: III - A patient with severe                        systemic disease.                       After obtaining informed consent, the endoscope was                        passed under direct vision. Throughout the procedure,                        the patient's blood pressure, pulse, and oxygen                        saturations were monitored continuously. The Endoscope                        was introduced through the mouth, and advanced to the  jejunum. The upper GI endoscopy was accomplished with                        ease. The patient tolerated the procedure well. Findings:      The esophagus was normal.      Evidence of a patent Billroth II gastrojejunostomy was found. The       gastrojejunal  anastomosis was characterized by congestion, erythema,       inflammation and ulceration. This was traversed. The efferent limb was       examined. This was biopsied with a cold forceps for histology.      The cardia and gastric fundus were normal on retroflexion.      The examined jejunum was normal. Impression:           - Normal esophagus.                       - Patent Billroth II gastrojejunostomy was found,                        characterized by congestion, erythema, inflammation and                        ulceration. Biopsied.                       - Normal examined jejunum. Recommendation:       - Await pathology results.                       - Ileoscopy today                       F/u biopsies of anastamotic ulcer vs crohns disease.                       PPI                       Carafate                       No smoking Procedure Code(s):    --- Professional ---                       [phone], Esophagogastroduodenoscopy, flexible, transoral;                        with biopsy, single or multiple Diagnosis Code(s):    --- Professional ---                       Z98.0, Intestinal bypass and anastomosis status                       K92.1, Melena (includes Hematochezia) CPT copyright 2017 American Medical Association. All rights reserved. The codes documented in this report are preliminary and upon coder review may  be revised to meet current compliance requirements. Wyline Mood, MD Wyline Mood MD, MD 03/29/2018 10:13:40 AM This report has been signed electronically. Number of Addenda: 0 Note Initiated On: 03/29/2018 9:49 AM      [facility]

## 2018-03-29 NOTE — H&P (Signed)
Wyline Mood, MD [address], [place], [place], [address] [address], [address], [place], [place], [address] Phone: [phone]  Fax: [phone]  Primary Care Physician:  System, Pcp Not In   Pre-Procedure History & Physical: HPI:  Adrienne Patel is a 77 y.o. female is here for an endoscopy and ileoscopy   Past Medical History:  Diagnosis Date  . CKD (chronic kidney disease)   . Crohn's disease (HCC)   . Headache   . MS (multiple sclerosis) (HCC)   . Osteoporosis   . Overactive bladder     Past Surgical History:  Procedure Laterality Date  . ABDOMINAL HYSTERECTOMY    . ankle left fracture    . DDD     cervical  . FRACTURE SURGERY    . ILEOSTOMY     1982  . TONSILLECTOMY      Prior to Admission medications   Medication Sig Start Date End Date Taking? Authorizing Provider  acetaminophen (TYLENOL) 500 MG tablet Take 1,000 mg by mouth every 8 (eight) hours.   Yes [provider]  amphetamine-dextroamphetamine (ADDERALL XR) 15 MG 24 hr capsule Take 30 mg by mouth every morning.   Yes [provider]  Cholecalciferol (VITAMIN D3) POWD Take 800 Units by mouth daily.   Yes [provider]  cyanocobalamin (,VITAMIN B-12,) 1000 MCG/ML injection Inject 1,000 mcg into the muscle every 30 (thirty) days.   Yes [provider]  escitalopram (LEXAPRO) 20 MG tablet Take 20 mg by mouth daily.   Yes [provider]  fesoterodine (TOVIAZ) 4 MG TB24 tablet Take 4 mg by mouth daily.   Yes [provider]  magnesium gluconate (MAGONATE) 500 MG tablet Take 500 mg by mouth 2 (two) times daily.   Yes [provider]  Multiple Vitamin (MULTIVITAMIN) capsule Take 2 capsules by mouth daily.   Yes [provider]  pantoprazole (PROTONIX) 40 MG tablet Take 40 mg by mouth daily.   Yes [provider]  tuberculin 5 UNIT/0.1ML injection Inject 5 Units into the skin every 7 (seven) days. 03/15/18 03/29/18 Yes  [provider]    Allergies as of 03/26/2018  . (Not on File)    History reviewed. No pertinent family history.  Social History   Socioeconomic History  . Marital status: Married    Spouse name: Not on file  . Number of children: Not on file  . Years of education: Not on file  . Highest education level: Not on file  Occupational History  . Not on file  Social Needs  . Financial resource strain: Not on file  . Food insecurity:    Worry: Not on file    Inability: Not on file  . Transportation needs:    Medical: Not on file    Non-medical: Not on file  Tobacco Use  . Smoking status: Former Smoker    Last attempt to quit: 03/27/1986    Years since quitting: 32.0  . Smokeless tobacco: Never Used  Substance and Sexual Activity  . Alcohol use: Not Currently  . Drug use: Yes    Types: Marijuana    Comment: "Years ago"  . Sexual activity: Not on file  Lifestyle  . Physical activity:    Days per week: Not on file    Minutes per session: Not on file  . Stress: Not on file  Relationships  . Social connections:    Talks on phone: Not on file    Gets  together: Not on file    Attends religious service: Not on file    Active member of club or organization: Not on file    Attends meetings of clubs or organizations: Not on file    Relationship status: Not on file  . Intimate partner violence:    Fear of current or ex partner: Not on file    Emotionally abused: Not on file    Physically abused: Not on file    Forced sexual activity: Not on file  Other Topics Concern  . Not on file  Social History Narrative  . Not on file    Review of Systems: See HPI, otherwise negative ROS  Physical Exam: BP 111/63 (BP Location: Right Arm)   Pulse 92   Temp 98.4 F (36.9 C) (Oral)   Resp 18   Ht 5\' 5"  (1.651 m)   Wt 122 lb 9.2 oz (55.6 kg)   SpO2 100%   BMI 20.40 kg/m  General:   Alert,  pleasant and cooperative in NAD Head:  Normocephalic and atraumatic. Neck:   Supple; no masses or thyromegaly. Lungs:  Clear throughout to auscultation, normal respiratory effort.    Heart:  +S1, +S2, Regular rate and rhythm, No edema. Abdomen:  Soft, nontender and nondistended. Normal bowel sounds, without guarding, and without rebound.   Neurologic:  Alert and  oriented x4;  grossly normal neurologically.  Impression/Plan: Adrienne Patel is here for an endoscopy and ileoscopy  to be performed for  evaluation of anemia/ GI bleeding.     Risks, benefits, limitations, and alternatives regarding endoscopy have been reviewed with the patient.  Questions have been answered.  All parties agreeable.   Wyline Mood, MD  03/29/2018, 9:41 AM

## 2018-03-29 NOTE — Anesthesia Procedure Notes (Signed)
Performed by: Delphina Schum, CRNA Pre-anesthesia Checklist: Patient identified, Emergency Drugs available, Suction available, Patient being monitored and Timeout performed Patient Re-evaluated:Patient Re-evaluated prior to induction Oxygen Delivery Method: Nasal cannula Induction Type: IV induction       

## 2018-03-29 NOTE — Transfer of Care (Signed)
Immediate Anesthesia Transfer of Care Note  Patient: Adrienne Patel  Procedure(s) Performed: ESOPHAGOGASTRODUODENOSCOPY (EGD) (N/A ) ILEOSCOPY THROUGH STOMA (N/A )  Patient Location: PACU  Anesthesia Type:General  Level of Consciousness: awake and responds to stimulation  Airway & Oxygen Therapy: Patient Spontanous Breathing and Patient connected to nasal cannula oxygen  Post-op Assessment: Report given to RN and Post -op Vital signs reviewed and stable  Post vital signs: Reviewed and stable  Last Vitals:  Vitals Value Taken Time  BP 111/60 03/29/2018 10:25 AM  Temp    Pulse 73 03/29/2018 10:25 AM  Resp 17 03/29/2018 10:25 AM  SpO2 99 % 03/29/2018 10:25 AM  Vitals shown include unvalidated device data.  Last Pain:  Vitals:   03/29/18 0947  TempSrc: Tympanic  PainSc:       Patients Stated Pain Goal: 0 (03/29/18 0201)  Complications: No apparent anesthesia complications

## 2018-03-29 NOTE — Anesthesia Post-op Follow-up Note (Signed)
Anesthesia QCDR form completed.        

## 2018-03-29 NOTE — Progress Notes (Signed)
[facility] [place] [facility]   PATIENT NAME: Adrienne Patel    MR#:  [id number]  DATE OF BIRTH:  [birth year]  SUBJECTIVE:  CHIEF COMPLAINT: Patient is resting comfortably with no complaints.  Had EGD today  REVIEW OF SYSTEMS:  CONSTITUTIONAL: No fever, fatigue or weakness.  EYES: No blurred or double vision.  EARS, NOSE, AND THROAT: No tinnitus or ear pain.  RESPIRATORY: No cough, shortness of breath, wheezing or hemoptysis.  CARDIOVASCULAR: No chest pain, orthopnea, edema.  GASTROINTESTINAL: No nausea, vomiting, diarrhea or abdominal pain.  GENITOURINARY: No dysuria, hematuria.  ENDOCRINE: No polyuria, nocturia,  HEMATOLOGY: No anemia, easy bruising or bleeding SKIN: No rash or lesion. MUSCULOSKELETAL: No joint pain or arthritis.   NEUROLOGIC: No tingling, numbness, weakness.  PSYCHIATRY: No anxiety or depression.   DRUG ALLERGIES:  No Known Allergies  VITALS:  Blood pressure 121/73, pulse 85, temperature 98.4 F (36.9 C), temperature source Axillary, resp. rate 19, height 5\' 5"  (1.651 m), weight 55.6 kg (122 lb 9.2 oz), SpO2 100 %.  PHYSICAL EXAMINATION:  GENERAL:  77 y.o.-year-old patient lying in the bed with no acute distress.  EYES: Pupils equal, round, reactive to light and accommodation. No scleral icterus. Extraocular muscles intact.  HEENT: Head atraumatic, normocephalic. Oropharynx and nasopharynx clear.  NECK:  Supple, no jugular venous distention. No thyroid enlargement, no tenderness.  LUNGS: Normal breath sounds bilaterally, no wheezing, rales,rhonchi or crepitation. No use of accessory muscles of respiration.  CARDIOVASCULAR: S1, S2 normal. No murmurs, rubs, or gallops.  ABDOMEN: Soft, nontender, nondistended. Bowel sounds present.  Ileostomy site intact with brown stool EXTREMITIES: No pedal edema, cyanosis, or clubbing.  NEUROLOGIC: Cranial nerves II through XII are intact. Muscle strength 5/5 in all extremities. Sensation  intact. Gait not checked.  PSYCHIATRIC: The patient is alert and oriented x 3.  SKIN: No obvious rash, lesion, or ulcer.    LABORATORY PANEL:   CBC Recent Labs  Lab 03/28/18 0325  03/28/18 2150  WBC 5.8  --   --   HGB 7.3*   < > 7.5*  HCT 22.3*   < > 23.0*  PLT 318  --   --    < > = values in this interval not displayed.   ------------------------------------------------------------------------------------------------------------------  Chemistries  Recent Labs  Lab 03/27/18 0034 03/28/18 0325  NA 143 146*  K 3.3* 4.7  CL 116* 123*  CO2 19* 17*  GLUCOSE 100* 88  BUN 38* 25*  CREATININE 2.14* 1.65*  CALCIUM 8.9 7.7*  AST 27  --   ALT 11*  --   ALKPHOS 59  --   BILITOT 0.4  --    ------------------------------------------------------------------------------------------------------------------  Cardiac Enzymes Recent Labs  Lab 03/27/18 0034  TROPONINI <0.03   ------------------------------------------------------------------------------------------------------------------  RADIOLOGY:  No results found.  EKG:   Orders placed or performed during the hospital encounter of 03/26/18  . ED EKG  . ED EKG  . EKG 12-Lead  . EKG 12-Lead    ASSESSMENT AND PLAN:    This is a 77 year old female admitted for GI bleeding. 1.  GI bleed: Melena; she has underlying Crohn's disease.  Patient hemoglobin is glad gradually declining over the past several months. Monitor hemoglobin and hematocrit closely  CT of the abdomen is negative for any acute process. 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 Biopsied. Patient is started on PPI and Carafate by GI  2.  UTI: Rule out urine culture with no growth discontinue antibiotics  3.  Overactive bladder: Continue Toviaz  4.  DVT prophylaxis: SCDs  5.  GI prophylaxis:  Pepcid     All the records are reviewed and case discussed with Care Management/Social Workerr. Management plans discussed with the patient, family and they are in agreement.  CODE STATUS: dnr  TOTAL TIME TAKING CARE OF THIS PATIENT: 34  minutes.   POSSIBLE D/C IN 1-2 DAYS, DEPENDING ON CLINICAL CONDITION.  Note: This dictation was prepared with Dragon dictation along with smaller phrase technology. Any transcriptional errors that result from this process are unintentional.   Ramonita Lab M.D on 03/29/2018 at 3:30 PM  Between 7am to 6pm - Pager - [phone] After 6pm go to [email or web address] - password [email or web address]  [facility]  Office  [phone]  CC: Primary care physician; System, Pcp Not In

## 2018-03-30 LAB — CBC
HCT: 29.6 % — ABNORMAL LOW (ref 35.0–47.0)
Hemoglobin: 9.8 g/dL — ABNORMAL LOW (ref 12.0–16.0)
MCH: 31.8 pg (ref 26.0–34.0)
MCHC: 33 g/dL (ref 32.0–36.0)
MCV: 96.4 fL (ref 80.0–100.0)
Platelets: 279 10*3/uL (ref 150–440)
RBC: 3.07 MIL/uL — ABNORMAL LOW (ref 3.80–5.20)
RDW: 24.1 % — ABNORMAL HIGH (ref 11.5–14.5)
WBC: 7.1 10*3/uL (ref 3.6–11.0)

## 2018-03-30 LAB — ZINC: Zinc: 96 ug/dL (ref 56–134)

## 2018-03-30 LAB — TYPE AND SCREEN
ABO/RH(D): O NEG
ANTIBODY SCREEN: NEGATIVE
Unit division: 0

## 2018-03-30 LAB — HEMOGLOBIN AND HEMATOCRIT, BLOOD
HEMATOCRIT: 24.8 % — AB (ref 35.0–47.0)
Hemoglobin: 8.2 g/dL — ABNORMAL LOW (ref 12.0–16.0)

## 2018-03-30 LAB — BPAM RBC
Blood Product Expiration Date: 201906062359
ISSUE DATE / TIME: 201906040338
Unit Type and Rh: 9500

## 2018-03-30 LAB — SURGICAL PATHOLOGY

## 2018-03-30 LAB — COPPER, SERUM: Copper: 78 ug/dL (ref 72–166)

## 2018-03-30 MED ORDER — ENSURE ENLIVE PO LIQD: 237.0000 mL | Freq: Three times a day (TID) | ORAL | 12 refills | Status: DC

## 2018-03-30 MED ORDER — ASCORBIC ACID 250 MG PO TABS: 250.0000 mg | ORAL_TABLET | Freq: Two times a day (BID) | ORAL | Status: DC

## 2018-03-30 MED ORDER — TRAMADOL HCL 50 MG PO TABS
100.0000 mg | ORAL_TABLET | Freq: Three times a day (TID) | ORAL | Status: DC | PRN
Start: 2018-03-30 — End: 2018-03-30

## 2018-03-30 MED ORDER — SUCRALFATE 1 G PO TABS: 1.0000 g | ORAL_TABLET | Freq: Three times a day (TID) | ORAL | Status: DC

## 2018-03-30 MED ORDER — TRAMADOL HCL 50 MG PO TABS
50.0000 mg | ORAL_TABLET | Freq: Once | ORAL | Status: AC
  Administered 2018-03-30: 50 mg via ORAL
  Filled 2018-03-30: qty 1

## 2018-03-30 MED ORDER — SUCRALFATE 1 G PO TABS
1.0000 g | ORAL_TABLET | Freq: Three times a day (TID) | ORAL | Status: DC
  Administered 2018-03-30: 1 g via ORAL
  Filled 2018-03-30: qty 1

## 2018-03-30 MED ORDER — PANTOPRAZOLE SODIUM 40 MG PO TBEC
40.0000 mg | DELAYED_RELEASE_TABLET | Freq: Two times a day (BID) | ORAL | Status: DC
  Administered 2018-03-30: 40 mg via ORAL
  Filled 2018-03-30: qty 1

## 2018-03-30 MED ORDER — ADULT MULTIVITAMIN W/MINERALS CH: 1.0000 | ORAL_TABLET | Freq: Every day | ORAL | Status: DC

## 2018-03-30 MED ORDER — PANCRELIPASE (LIP-PROT-AMYL) 36000-114000 UNITS PO CPEP: 72000.0000 [IU] | ORAL_CAPSULE | Freq: Three times a day (TID) | ORAL | Status: DC

## 2018-03-30 MED ORDER — TRAMADOL HCL 50 MG PO TABS: 50.0000 mg | ORAL_TABLET | Freq: Four times a day (QID) | ORAL | 0 refills | Status: DC | PRN

## 2018-03-30 MED ORDER — FERROUS SULFATE 325 (65 FE) MG PO TBEC: 325.0000 mg | DELAYED_RELEASE_TABLET | Freq: Two times a day (BID) | ORAL | 3 refills | Status: DC

## 2018-03-30 MED ORDER — ACETAMINOPHEN 325 MG PO TABS: 650.0000 mg | ORAL_TABLET | Freq: Four times a day (QID) | ORAL | Status: DC | PRN

## 2018-03-30 MED ORDER — PANTOPRAZOLE SODIUM 40 MG PO TBEC: 40.0000 mg | DELAYED_RELEASE_TABLET | Freq: Two times a day (BID) | ORAL | Status: DC

## 2018-03-30 MED ORDER — DOCUSATE SODIUM 100 MG PO CAPS: 100.0000 mg | ORAL_CAPSULE | Freq: Two times a day (BID) | ORAL | 0 refills | Status: DC | PRN

## 2018-03-30 NOTE — Discharge Summary (Signed)
[facility] [place] [facility]   PATIENT NAME: Adrienne Patel    MR#:  [id number]  DATE OF BIRTH:  [birth year]  DATE OF ADMISSION:  03/26/2018 ADMITTING PHYSICIAN: Arnaldo Natal, MD  DATE OF DISCHARGE:  03/30/18  PRIMARY CARE PHYSICIAN: System, Pcp Not In    ADMISSION DIAGNOSIS:  Kidney stone [N20.0] Urinary tract infection without hematuria, site unspecified [N39.0] Gastrointestinal hemorrhage, unspecified gastrointestinal hemorrhage type [K92.2] Biliary calculus of other site without obstruction [K80.80]  DISCHARGE DIAGNOSIS:  Active Problems:   GIB (gastrointestinal bleeding)   Protein-calorie malnutrition, severe   GI bleed   SECONDARY DIAGNOSIS:   Past Medical History:  Diagnosis Date  . CKD (chronic kidney disease)   . Crohn's disease (HCC)   . Headache   . MS (multiple sclerosis) (HCC)   . Osteoporosis   . Overactive bladder     HOSPITAL COURSE:   HPI: The patient with past medical history sniffing and for CKD, Crohn's disease and multiple sclerosis presents to the emergency department from her nursing home with bleeding from her ileostomy site.  The patient states that she believed she had bleeding from the adhesive area around her ileostomy bag when the nursing staff changed it.  However in the emergency department the treatment team did not observe any trauma to the skin.  However when the ileostomy bag was removed the patient did have melanotic stool in place.  The patient admits to some abdominal pain but denies nausea.  She appears to also have some element of dementia which makes her history is somewhat less reliable.  Due to her GI bleeding the emergency department staff called the hospitalist service for admission.     1. GI bleed: Melena; she has underlying Crohn's disease.  Patient hemoglobin is glad gradually declining over the past several months. 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 Biopsied.  PPI and Carafate started okay to discharge patient from GI standpoint CT of the abdomen is negative for any acute process. Hemoglobin 7.1-7.3-7.7-7.5 after 1 unit of blood transfusion, today hemoglobin at 8.2 Patient is seen by GI, recommending work-up for macrocytic anemia ferritin 526, iron 42, folate 40, B12 3097  zinc and copper levels pending Patient is started on Creon and nutrition supplements Ensure 3 times a day Outpatient follow-up with gastroenterology in 1 month  Iron supplements added to the regimen  2. UTI: Ruled out urine culture with no growth discontinue antibiotics  3. Overactive bladder: Continue Toviaz  4. DVT prophylaxis: SCDs  5. GI prophylaxis:  Pepcid  AKI significantly improved creatinine 2.14-1.3  Tramadol as needed for chronic shoulder pain  DISCHARGE CONDITIONS:   stable  CONSULTS OBTAINED:     PROCEDURES EGD  DRUG ALLERGIES:  No Known Allergies  DISCHARGE MEDICATIONS:   Allergies as of 03/30/2018   No Known Allergies     Medication List    STOP taking these medications   multivitamin capsule   tuberculin 5 UNIT/0.1ML injection     TAKE these medications   acetaminophen 325 MG tablet Commonly known as:  TYLENOL Take 2 tablets (650 mg total) by mouth every 6 (six) hours as needed for mild pain (or Fever >/= 101). What changed:    medication strength  how much to take  when to take this  reasons to take this   amphetamine-dextroamphetamine 15 MG 24 hr capsule Commonly known as:  ADDERALL XR Take 30 mg by mouth every  morning.   ascorbic acid 250 MG tablet Commonly known as:  VITAMIN C Take 1 tablet (250 mg total) by mouth 2 (two) times daily.   cyanocobalamin 1000 MCG/ML injection Commonly known as:  (VITAMIN B-12) Inject 1,000 mcg into the muscle every 30 (thirty) days.   docusate sodium 100 MG capsule Commonly known as:   COLACE Take 1 capsule (100 mg total) by mouth 2 (two) times daily as needed for mild constipation.   escitalopram 20 MG tablet Commonly known as:  LEXAPRO Take 20 mg by mouth daily.   feeding supplement (ENSURE ENLIVE) Liqd Take 237 mLs by mouth 3 (three) times daily between meals.   ferrous sulfate 325 (65 FE) MG EC tablet Take 1 tablet (325 mg total) by mouth 2 times daily at 12 noon and 4 pm.   fesoterodine 4 MG Tb24 tablet Commonly known as:  TOVIAZ Take 4 mg by mouth daily.   lipase/protease/amylase 25366 UNITS Cpep capsule Commonly known as:  CREON Take 2 capsules (72,000 Units total) by mouth 3 (three) times daily with meals.   magnesium gluconate 500 MG tablet Commonly known as:  MAGONATE Take 500 mg by mouth 2 (two) times daily.   multivitamin with minerals Tabs tablet Take 1 tablet by mouth daily. Start taking on:  03/31/2018   pantoprazole 40 MG tablet Commonly known as:  PROTONIX Take 1 tablet (40 mg total) by mouth 2 (two) times daily. What changed:  when to take this   sucralfate 1 g tablet Commonly known as:  CARAFATE Take 1 tablet (1 g total) by mouth 4 (four) times daily -  with meals and at bedtime.   traMADol 50 MG tablet Commonly known as:  ULTRAM Take 1-2 tablets (50-100 mg total) by mouth every 6 (six) hours as needed for moderate pain or severe pain.   Vitamin D3 Powd Take 800 Units by mouth daily.        DISCHARGE INSTRUCTIONS:   Follow-up with primary care physician in 4 to 5 days Follow-up with gastroenterology in 3 to 4 weeks Continue ileostomy care  DIET:  Regular diet  Continue Ensure Enlive 3 times a day and boost 1 can in between the meals 3 times a day  DISCHARGE CONDITION:  Fair  ACTIVITY:  Activity as tolerated  OXYGEN:  Home Oxygen: No.   Oxygen Delivery: room air  DISCHARGE LOCATION:  nursing home   If you experience worsening of your admission symptoms, develop shortness of breath, life threatening emergency,  suicidal or homicidal thoughts you must seek medical attention immediately by calling 911 or calling your MD immediately  if symptoms less severe.  You Must read complete instructions/literature along with all the possible adverse reactions/side effects for all the Medicines you take and that have been prescribed to you. Take any new Medicines after you have completely understood and accpet all the possible adverse reactions/side effects.   Please note  You were cared for by a hospitalist during your hospital stay. If you have any questions about your discharge medications or the care you received while you were in the hospital after you are discharged, you can call the unit and asked to speak with the hospitalist on call if the hospitalist that took care of you is not available. Once you are discharged, your primary care physician will handle any further medical issues. Please note that NO REFILLS for any discharge medications will be authorized once you are discharged, as it is imperative that you return to your  primary care physician (or establish a relationship with a primary care physician if you do not have one) for your aftercare needs so that they can reassess your need for medications and monitor your lab values.     Today  Chief Complaint  Patient presents with  . bleeding from ileostomy site   Patient is feeling much better.  No other episodes of bleeding noticed.  Okay to discharge patient from GI standpoint.  Discussed with patient and her daughter [name] over phone, agreeable with the plan  ROS:  CONSTITUTIONAL: Denies fevers, chills. Denies any fatigue, weakness.  EYES: Denies blurry vision, double vision, eye pain. EARS, NOSE, THROAT: Denies tinnitus, ear pain, hearing loss. RESPIRATORY: Denies cough, wheeze, shortness of breath.  CARDIOVASCULAR: Denies chest pain, palpitations, edema.  GASTROINTESTINAL: Denies nausea, vomiting, diarrhea, abdominal pain. Denies bright red blood  per rectum.  Chronic ileostomy GENITOURINARY: Denies dysuria, hematuria. ENDOCRINE: Denies nocturia or thyroid problems. HEMATOLOGIC AND LYMPHATIC: Denies easy bruising or bleeding. SKIN: Denies rash or lesion. MUSCULOSKELETAL: Denies pain in neck, back, shoulder, knees, hips or arthritic symptoms.  NEUROLOGIC: Denies paralysis, paresthesias.  PSYCHIATRIC: Denies anxiety or depressive symptoms.   VITAL SIGNS:  Blood pressure 125/64, pulse 90, temperature 98.5 F (36.9 C), temperature source Oral, resp. rate 18, height 5\' 5"  (1.651 m), weight 56 kg (123 lb 7.3 oz), SpO2 100 %.  I/O:    Intake/Output Summary (Last 24 hours) at 03/30/2018 1247 Last data filed at 03/30/2018 0605 Gross per 24 hour  Intake 480 ml  Output 150 ml  Net 330 ml    PHYSICAL EXAMINATION:  GENERAL:  77 y.o.-year-old patient lying in the bed with no acute distress.  EYES: Pupils equal, round, reactive to light and accommodation. No scleral icterus. Extraocular muscles intact.  HEENT: Head atraumatic, normocephalic. Oropharynx and nasopharynx clear.  NECK:  Supple, no jugular venous distention. No thyroid enlargement, no tenderness.  LUNGS: Normal breath sounds bilaterally, no wheezing, rales,rhonchi or crepitation. No use of accessory muscles of respiration.  CARDIOVASCULAR: S1, S2 normal. No murmurs, rubs, or gallops.  ABDOMEN: Soft, non-tender, non-distended. Bowel sounds present.  Chronic ileostomy with no blood  eXTREMITIES: No pedal edema, cyanosis, or clubbing.  NEUROLOGIC: Cranial nerves II through XII are intact. Muscle strength 5/5 in all extremities. Sensation intact. Gait not checked.  PSYCHIATRIC: The patient is alert and oriented x 3.  SKIN: No obvious rash, lesion, or ulcer.   DATA REVIEW:   CBC Recent Labs  Lab 03/30/18 0957  WBC 7.1  HGB 9.8*  HCT 29.6*  PLT 279    Chemistries  Recent Labs  Lab 03/27/18 0034  03/29/18 1641  NA 143   < > 140  K 3.3*   < > 4.6  CL 116*   < > 121*   CO2 19*   < > 16*  GLUCOSE 100*   < > 90  BUN 38*   < > 18  CREATININE 2.14*   < > 1.33*  CALCIUM 8.9   < > 7.9*  AST 27  --   --   ALT 11*  --   --   ALKPHOS 59  --   --   BILITOT 0.4  --   --    < > = values in this interval not displayed.    Cardiac Enzymes Recent Labs  Lab 03/27/18 0034  TROPONINI <0.03    Microbiology Results  Results for orders placed or performed during the hospital encounter of 03/26/18  Urine culture  Status: None   Collection Time: 03/27/18  2:33 AM  Result Value Ref Range Status   Specimen Description   Final    URINE, CATHETERIZED Performed at [facility], [address] [place], [place] [address]    Special Requests   Final    NONE Performed at [facility], [address], [place], [place] [address]    Culture   Final    NO GROWTH Performed at [facility], [address], [place], [place] [address]    Report Status 03/28/2018 FINAL  Final    RADIOLOGY:  Ct Abdomen Pelvis Wo Contrast  Result Date: 03/27/2018 CLINICAL DATA:  Pain and bleeding via ileostomy site. History of Crohn's disease. EXAM: CT ABDOMEN AND PELVIS WITHOUT CONTRAST TECHNIQUE: Multidetector CT imaging of the abdomen and pelvis was performed following the standard protocol without IV contrast. Enteric contrast administered. COMPARISON:  None. FINDINGS: LOWER CHEST: Minimal atelectasis/scarring. The visualized heart size is normal. Partially imaged coronary artery calcification. No pericardial effusion. HEPATOBILIARY: Multiple mildly dense gallstones with mild gallbladder wall thickening. Common bile duct is 15 mm in transaxial dimension. No identified choledocholithiasis. PANCREAS: Normal. SPLEEN: Normal. ADRENALS/URINARY TRACT: Mildly low-lying LEFT kidney with moderate hydroureteronephrosis. Mildly atrophic LEFT kidney. 1 x 2 cm LEFT renal pelvis calculus. Punctate nonobstructing RIGHT nephrolithiasis. Limited assessment for renal masses by  nonenhanced CT. Subcentimeter RIGHT upper pole dense exophytic probable hemorrhagic cyst. The unopacified ureters are normal in course and caliber. Urinary bladder is partially distended and unremarkable. Normal adrenal glands. STOMACH/BOWEL: Surgical clips about the GE junction compatible with fundoplication. Status post gastro jejunostomy with thickened appearance of proximal anastomosis. Contrast in the biliary loop and feeding loops. Contrast to the level of the ileostomy and within bag. Status post colectomy. VASCULAR/LYMPHATIC: Aortoiliac vessels are normal in course and caliber. Moderate calcific atherosclerosis. No lymphadenopathy by CT size criteria. REPRODUCTIVE: Status post hysterectomy. OTHER: No intraperitoneal free fluid or free air. MUSCULOSKELETAL: Non-acute. Thoracolumbar levoscoliosis. Osteopenia. Severe L2-3 and L3-4 degenerative discs. Anterior abdominal wall scarring. IMPRESSION: 1. Cholelithiasis and suspected acute cholecystitis. Biliary dilatation without identified choledocholithiasis. 2. Age indeterminate thickened gastrojejunostomy anastomosis, possibly inflammatory without bowel obstruction. Status post colectomy. 3. Moderate LEFT hydroureteronephrosis, UVJ stricture or mass possible. 1 x 2 cm LEFT nephrolithiasis. Electronically Signed   By: Awilda Metro M.D.   On: 03/27/2018 03:34   Dg Chest Port 1 View  Result Date: 03/27/2018 CLINICAL DATA:  Acute onset of bleeding and pain about the ileostomy site. EXAM: PORTABLE CHEST 1 VIEW COMPARISON:  None. FINDINGS: The lungs are well-aerated. Peribronchial thickening is noted. Vague right midlung opacity is nonspecific; though it could reflect scarring, pneumonia cannot be excluded. There is no evidence of pleural effusion or pneumothorax. The cardiomediastinal silhouette is within normal limits. No acute osseous abnormalities are seen. Postoperative change is noted about the gastroesophageal junction. Cervical spinal fusion hardware is  noted. IMPRESSION: Peribronchial thickening. Vague right midlung opacity is nonspecific. Though it could reflect scarring, pneumonia cannot be excluded. Followup PA and lateral chest X-ray is recommended in 3-4 weeks following trial of antibiotic therapy (if the patient has symptoms of pneumonia) to ensure resolution and exclude underlying malignancy. Alternatively, CT could be considered to exclude underlying mass. Electronically Signed   By: Roanna Raider M.D.   On: 03/27/2018 00:46    EKG:   Orders placed or performed during the hospital encounter of 03/26/18  . ED EKG  . ED EKG  . EKG 12-Lead  . EKG 12-Lead  Management plans discussed with the patient, family and they are in agreement.  CODE STATUS:     Code Status Orders  (From admission, onward)        Start     Ordered   03/27/18 0522  Do not attempt resuscitation (DNR)  Continuous    Question Answer Comment  In the event of cardiac or respiratory ARREST Do not call a "code blue"   In the event of cardiac or respiratory ARREST Do not perform Intubation, CPR, defibrillation or ACLS   In the event of cardiac or respiratory ARREST Use medication by any route, position, wound care, and other measures to relive pain and suffering. May use oxygen, suction and manual treatment of airway obstruction as needed for comfort.      03/27/18 0521    Code Status History    This patient has a current code status but no historical code status.    Advance Directive Documentation     Most Recent Value  Type of Advance Directive  Healthcare Power of Attorney, Living will, Out of facility DNR (pink MOST or yellow form)  Pre-existing out of facility DNR order (yellow form or pink MOST form)  -  "MOST" Form in Place?  -      TOTAL TIME TAKING CARE OF THIS PATIENT: 42 minutes.   Note: This dictation was prepared with Dragon dictation along with smaller phrase technology. Any transcriptional errors that result from this process are  unintentional.   @[facility] @  on 03/30/2018 at 12:47 PM  Between 7am to 6pm - Pager - [phone]  After 6pm go to [email or web address] - password [email or web address]  [facility]  Office  [phone]  CC: Primary care physician; System, Pcp Not In

## 2018-03-30 NOTE — Progress Notes (Signed)
Melburn Popper to be D/C'd Skilled nursing facility per MD order.  Discussed prescriptions and follow up appointments with the Patel. Prescriptions given to Patel, medication list explained in detail. Pt verbalized understanding.  Allergies as of 03/30/2018   No Known Allergies     Medication List    STOP taking these medications   multivitamin capsule   tuberculin 5 UNIT/0.1ML injection     TAKE these medications   acetaminophen 325 MG tablet Commonly known as:  TYLENOL Take 2 tablets (650 mg total) by mouth every 6 (six) hours as needed for mild pain (or Fever >/= 101). What changed:    medication strength  how much to take  when to take this  reasons to take this   amphetamine-dextroamphetamine 15 MG 24 hr capsule Commonly known as:  ADDERALL XR Take 30 mg by mouth every morning.   ascorbic acid 250 MG tablet Commonly known as:  VITAMIN C Take 1 tablet (250 mg total) by mouth 2 (two) times daily.   cyanocobalamin 1000 MCG/ML injection Commonly known as:  (VITAMIN B-12) Inject 1,000 mcg into the muscle every 30 (thirty) days.   docusate sodium 100 MG capsule Commonly known as:  COLACE Take 1 capsule (100 mg total) by mouth 2 (two) times daily as needed for mild constipation.   escitalopram 20 MG tablet Commonly known as:  LEXAPRO Take 20 mg by mouth daily.   feeding supplement (ENSURE ENLIVE) Liqd Take 237 mLs by mouth 3 (three) times daily between meals.   ferrous sulfate 325 (65 FE) MG EC tablet Take 1 tablet (325 mg total) by mouth 2 times daily at 12 noon and 4 pm.   fesoterodine 4 MG Tb24 tablet Commonly known as:  TOVIAZ Take 4 mg by mouth daily.   lipase/protease/amylase 93552 UNITS Cpep capsule Commonly known as:  CREON Take 2 capsules (72,000 Units total) by mouth 3 (three) times daily with meals.   magnesium gluconate 500 MG tablet Commonly known as:  MAGONATE Take 500 mg by mouth 2 (two) times daily.   multivitamin with minerals Tabs  tablet Take 1 tablet by mouth daily. Start taking on:  03/31/2018   pantoprazole 40 MG tablet Commonly known as:  PROTONIX Take 1 tablet (40 mg total) by mouth 2 (two) times daily. What changed:  when to take this   sucralfate 1 g tablet Commonly known as:  CARAFATE Take 1 tablet (1 g total) by mouth 4 (four) times daily -  with meals and at bedtime.   traMADol 50 MG tablet Commonly known as:  ULTRAM Take 1-2 tablets (50-100 mg total) by mouth every 6 (six) hours as needed for moderate pain or severe pain.   Vitamin D3 Powd Take 800 Units by mouth daily.       Vitals:   03/30/18 0536 03/30/18 0800  BP: 121/86 125/64  Pulse: 98 90  Resp: 18 18  Temp: 98.8 F (37.1 C) 98.5 F (36.9 C)  SpO2: 100% 100%    Skin clean, dry and intact without evidence of skin break down, no evidence of skin tears noted. IV catheter discontinued intact. Site without signs and symptoms of complications. Dressing and pressure applied. Pt denies pain at this time. No complaints noted.  An After Visit Summary was printed and given to the Patel. Patel escorted via WC, and D/C to [facility] via daughter.  Adrienne Patel

## 2018-03-30 NOTE — Discharge Instructions (Signed)
Follow-up with primary care physician in 4 to 5 days Follow-up with gastroenterology in 3 to 4 weeks Continue ileostomy care

## 2018-03-30 NOTE — Clinical Social Work Note (Signed)
Patient going to [facility] today. Patient is notifying her daughter so she can transport. [facility] is aware and discharge information sent.  York Spaniel MSW,LCSW [phone]

## 2018-03-31 ENCOUNTER — Encounter: Payer: Self-pay | Admitting: Gastroenterology

## 2018-04-03 ENCOUNTER — Other Ambulatory Visit
Admission: RE | Admit: 2018-04-03 | Discharge: 2018-04-03 | Disposition: A | Discharge status: Home / Self Care | Payer: No Typology Code available for payment source | Source: Ambulatory Visit | Attending: Family Medicine | Admitting: Family Medicine

## 2018-04-03 DIAGNOSIS — N390 Urinary tract infection, site not specified: Secondary | ICD-10-CM | POA: Diagnosis present

## 2018-04-03 LAB — URINALYSIS, COMPLETE (UACMP) WITH MICROSCOPIC
BILIRUBIN URINE: NEGATIVE
Bacteria, UA: NONE SEEN
GLUCOSE, UA: NEGATIVE mg/dL
HGB URINE DIPSTICK: NEGATIVE
Ketones, ur: NEGATIVE mg/dL
NITRITE: NEGATIVE
Protein, ur: 30 mg/dL — AB
RBC / HPF: >50 RBC/hpf — ABNORMAL HIGH (ref 0–5)
SPECIFIC GRAVITY, URINE: 1.013 (ref 1.005–1.030)
WBC, UA: >50 WBC/hpf — ABNORMAL HIGH (ref 0–5)
pH: 5 (ref 5.0–8.0)

## 2018-04-04 LAB — URINE CULTURE

## 2018-04-27 ENCOUNTER — Ambulatory Visit: Payer: Medicare Other | Admitting: Gastroenterology

## 2018-05-23 ENCOUNTER — Other Ambulatory Visit: Payer: Self-pay

## 2018-05-23 ENCOUNTER — Inpatient Hospital Stay
Admission: EM | Admit: 2018-05-23 | Discharge: 2018-05-26 | DRG: 871 | Disposition: A | Discharge status: Hospice | Payer: Medicare Other | Attending: Internal Medicine | Admitting: Internal Medicine

## 2018-05-23 ENCOUNTER — Emergency Department: Payer: Medicare Other

## 2018-05-23 DIAGNOSIS — N3281 Overactive bladder: Secondary | ICD-10-CM | POA: Diagnosis present

## 2018-05-23 DIAGNOSIS — N179 Acute kidney failure, unspecified: Secondary | ICD-10-CM

## 2018-05-23 DIAGNOSIS — R34 Anuria and oliguria: Secondary | ICD-10-CM | POA: Diagnosis present

## 2018-05-23 DIAGNOSIS — Z515 Encounter for palliative care: Secondary | ICD-10-CM | POA: Diagnosis not present

## 2018-05-23 DIAGNOSIS — R627 Adult failure to thrive: Secondary | ICD-10-CM | POA: Diagnosis present

## 2018-05-23 DIAGNOSIS — R64 Cachexia: Secondary | ICD-10-CM | POA: Diagnosis present

## 2018-05-23 DIAGNOSIS — Z66 Do not resuscitate: Secondary | ICD-10-CM | POA: Diagnosis present

## 2018-05-23 DIAGNOSIS — J189 Pneumonia, unspecified organism: Secondary | ICD-10-CM | POA: Diagnosis not present

## 2018-05-23 DIAGNOSIS — G35 Multiple sclerosis: Secondary | ICD-10-CM | POA: Diagnosis present

## 2018-05-23 DIAGNOSIS — M810 Age-related osteoporosis without current pathological fracture: Secondary | ICD-10-CM | POA: Diagnosis present

## 2018-05-23 DIAGNOSIS — J181 Lobar pneumonia, unspecified organism: Secondary | ICD-10-CM | POA: Diagnosis present

## 2018-05-23 DIAGNOSIS — Z79891 Long term (current) use of opiate analgesic: Secondary | ICD-10-CM | POA: Diagnosis not present

## 2018-05-23 DIAGNOSIS — Z87891 Personal history of nicotine dependence: Secondary | ICD-10-CM

## 2018-05-23 DIAGNOSIS — Y95 Nosocomial condition: Secondary | ICD-10-CM | POA: Diagnosis present

## 2018-05-23 DIAGNOSIS — E43 Unspecified severe protein-calorie malnutrition: Secondary | ICD-10-CM | POA: Diagnosis present

## 2018-05-23 DIAGNOSIS — J9601 Acute respiratory failure with hypoxia: Secondary | ICD-10-CM | POA: Diagnosis present

## 2018-05-23 DIAGNOSIS — Z90710 Acquired absence of both cervix and uterus: Secondary | ICD-10-CM | POA: Diagnosis not present

## 2018-05-23 DIAGNOSIS — N136 Pyonephrosis: Secondary | ICD-10-CM | POA: Diagnosis present

## 2018-05-23 DIAGNOSIS — L89151 Pressure ulcer of sacral region, stage 1: Secondary | ICD-10-CM | POA: Diagnosis present

## 2018-05-23 DIAGNOSIS — N183 Chronic kidney disease, stage 3 (moderate): Secondary | ICD-10-CM | POA: Diagnosis present

## 2018-05-23 DIAGNOSIS — Z932 Ileostomy status: Secondary | ICD-10-CM

## 2018-05-23 DIAGNOSIS — K5090 Crohn's disease, unspecified, without complications: Secondary | ICD-10-CM | POA: Diagnosis present

## 2018-05-23 DIAGNOSIS — E871 Hypo-osmolality and hyponatremia: Secondary | ICD-10-CM | POA: Diagnosis present

## 2018-05-23 DIAGNOSIS — Z7189 Other specified counseling: Secondary | ICD-10-CM | POA: Diagnosis not present

## 2018-05-23 DIAGNOSIS — Z79899 Other long term (current) drug therapy: Secondary | ICD-10-CM

## 2018-05-23 DIAGNOSIS — D631 Anemia in chronic kidney disease: Secondary | ICD-10-CM | POA: Diagnosis present

## 2018-05-23 DIAGNOSIS — E872 Acidosis: Secondary | ICD-10-CM | POA: Diagnosis present

## 2018-05-23 DIAGNOSIS — Z681 Body mass index (BMI) 19 or less, adult: Secondary | ICD-10-CM | POA: Diagnosis not present

## 2018-05-23 DIAGNOSIS — A419 Sepsis, unspecified organism: Principal | ICD-10-CM | POA: Diagnosis present

## 2018-05-23 DIAGNOSIS — L8990 Pressure ulcer of unspecified site, unspecified stage: Secondary | ICD-10-CM

## 2018-05-23 DIAGNOSIS — R6521 Severe sepsis with septic shock: Secondary | ICD-10-CM | POA: Diagnosis present

## 2018-05-23 DIAGNOSIS — Z8249 Family history of ischemic heart disease and other diseases of the circulatory system: Secondary | ICD-10-CM

## 2018-05-23 LAB — CBC WITH DIFFERENTIAL/PLATELET
Basophils Absolute: 0 10*3/uL (ref 0–0.1)
Basophils Relative: 0 %
Eosinophils Absolute: 0 10*3/uL (ref 0–0.7)
Eosinophils Relative: 0 %
HCT: 52.5 % — ABNORMAL HIGH (ref 35.0–47.0)
HEMOGLOBIN: 17.5 g/dL — AB (ref 12.0–16.0)
Lymphocytes Relative: 4 %
Lymphs Abs: 0.4 10*3/uL — ABNORMAL LOW (ref 1.0–3.6)
MCH: 30.6 pg (ref 26.0–34.0)
MCHC: 33.4 g/dL (ref 32.0–36.0)
MCV: 91.7 fL (ref 80.0–100.0)
MONO ABS: 0.4 10*3/uL (ref 0.2–0.9)
Monocytes Relative: 4 %
NEUTROS ABS: 7.7 10*3/uL — AB (ref 1.4–6.5)
NEUTROS PCT: 92 %
Platelets: 350 10*3/uL (ref 150–440)
RBC: 5.73 MIL/uL — ABNORMAL HIGH (ref 3.80–5.20)
RDW: 15.1 % — AB (ref 11.5–14.5)
WBC: 8.4 10*3/uL (ref 3.6–11.0)

## 2018-05-23 LAB — COMPREHENSIVE METABOLIC PANEL
ALBUMIN: 3.6 g/dL (ref 3.5–5.0)
ALT: 11 U/L (ref 0–44)
ANION GAP: 17 — AB (ref 5–15)
AST: 21 U/L (ref 15–41)
Alkaline Phosphatase: 135 U/L — ABNORMAL HIGH (ref 38–126)
BUN: 59 mg/dL — ABNORMAL HIGH (ref 8–23)
CHLORIDE: 101 mmol/L (ref 98–111)
CO2: 15 mmol/L — AB (ref 22–32)
Calcium: 11 mg/dL — ABNORMAL HIGH (ref 8.9–10.3)
Creatinine, Ser: 4.89 mg/dL — ABNORMAL HIGH (ref 0.44–1.00)
GFR calc Af Amer: 9 mL/min — ABNORMAL LOW (ref >60)
GFR calc non Af Amer: 8 mL/min — ABNORMAL LOW (ref >60)
Glucose, Bld: 98 mg/dL (ref 70–99)
POTASSIUM: 3.5 mmol/L (ref 3.5–5.1)
SODIUM: 133 mmol/L — AB (ref 135–145)
Total Bilirubin: 0.6 mg/dL (ref 0.3–1.2)
Total Protein: 7.2 g/dL (ref 6.5–8.1)

## 2018-05-23 LAB — BLOOD GAS, ARTERIAL
Acid-base deficit: 16.3 mmol/L — ABNORMAL HIGH (ref 0.0–2.0)
Bicarbonate: 10.9 mmol/L — ABNORMAL LOW (ref 20.0–28.0)
FIO2: 0.36
O2 SAT: 71.8 %
PATIENT TEMPERATURE: 37
PO2 ART: 49 mmHg — AB (ref 83.0–108.0)
pCO2 arterial: 30 mmHg — ABNORMAL LOW (ref 32.0–48.0)
pH, Arterial: 7.17 — CL (ref 7.350–7.450)

## 2018-05-23 LAB — TROPONIN I

## 2018-05-23 LAB — MRSA PCR SCREENING: MRSA BY PCR: NEGATIVE

## 2018-05-23 LAB — LACTIC ACID, PLASMA: LACTIC ACID, VENOUS: 4.5 mmol/L — AB (ref 0.5–1.9)

## 2018-05-23 LAB — GLUCOSE, CAPILLARY: GLUCOSE-CAPILLARY: 87 mg/dL (ref 70–99)

## 2018-05-23 MED ORDER — ACETAMINOPHEN 650 MG RE SUPP: 650.0000 mg | Freq: Four times a day (QID) | RECTAL | Status: DC | PRN

## 2018-05-23 MED ORDER — HEPARIN SODIUM (PORCINE) 5000 UNIT/ML IJ SOLN
5000.0000 [IU] | Freq: Three times a day (TID) | INTRAMUSCULAR | Status: DC
  Administered 2018-05-23 – 2018-05-24 (×2): 5000 [IU] via SUBCUTANEOUS
  Filled 2018-05-23 (×2): qty 1

## 2018-05-23 MED ORDER — SUCRALFATE 1 G PO TABS
1.0000 g | ORAL_TABLET | Freq: Three times a day (TID) | ORAL | Status: DC
  Administered 2018-05-24 (×3): 1 g via ORAL
  Filled 2018-05-23 (×4): qty 1

## 2018-05-23 MED ORDER — ADULT MULTIVITAMIN W/MINERALS CH
1.0000 | ORAL_TABLET | Freq: Every day | ORAL | Status: DC
  Administered 2018-05-24: 1 via ORAL
  Filled 2018-05-23: qty 1

## 2018-05-23 MED ORDER — ACETAMINOPHEN 325 MG PO TABS: 650.0000 mg | ORAL_TABLET | Freq: Four times a day (QID) | ORAL | Status: DC | PRN

## 2018-05-23 MED ORDER — ENSURE ENLIVE PO LIQD
237.0000 mL | Freq: Three times a day (TID) | ORAL | Status: DC
  Administered 2018-05-24: 237 mL via ORAL

## 2018-05-23 MED ORDER — SODIUM CHLORIDE 0.9 % IV BOLUS (SEPSIS)
250.0000 mL | Freq: Once | INTRAVENOUS | Status: AC
  Administered 2018-05-23: 250 mL via INTRAVENOUS

## 2018-05-23 MED ORDER — ORAL CARE MOUTH RINSE
15.0000 mL | Freq: Two times a day (BID) | OROMUCOSAL | Status: DC
  Administered 2018-05-24 – 2018-05-25 (×3): 15 mL via OROMUCOSAL

## 2018-05-23 MED ORDER — GUAIFENESIN 100 MG/5ML PO SOLN
5.0000 mL | ORAL | Status: DC | PRN
  Filled 2018-05-23: qty 5

## 2018-05-23 MED ORDER — SODIUM CHLORIDE 0.9 % IV BOLUS
1000.0000 mL | Freq: Once | INTRAVENOUS | Status: AC
  Administered 2018-05-23: 1000 mL via INTRAVENOUS

## 2018-05-23 MED ORDER — ALBUTEROL SULFATE (2.5 MG/3ML) 0.083% IN NEBU
2.5000 mg | INHALATION_SOLUTION | Freq: Four times a day (QID) | RESPIRATORY_TRACT | Status: DC
  Administered 2018-05-23 – 2018-05-24 (×3): 2.5 mg via RESPIRATORY_TRACT
  Filled 2018-05-23 (×5): qty 3

## 2018-05-23 MED ORDER — SENNOSIDES-DOCUSATE SODIUM 8.6-50 MG PO TABS: 1.0000 | ORAL_TABLET | Freq: Every evening | ORAL | Status: DC | PRN

## 2018-05-23 MED ORDER — CHOLECALCIFEROL 10 MCG (400 UNIT) PO TABS
800.0000 [IU] | ORAL_TABLET | Freq: Every day | ORAL | Status: DC
  Administered 2018-05-24: 800 [IU] via ORAL
  Filled 2018-05-23 (×2): qty 2

## 2018-05-23 MED ORDER — PANCRELIPASE (LIP-PROT-AMYL) 12000-38000 UNITS PO CPEP
72000.0000 [IU] | ORAL_CAPSULE | Freq: Three times a day (TID) | ORAL | Status: DC
  Administered 2018-05-24: 72000 [IU] via ORAL
  Filled 2018-05-23: qty 6

## 2018-05-23 MED ORDER — KETOROLAC TROMETHAMINE 15 MG/ML IJ SOLN
15.0000 mg | Freq: Four times a day (QID) | INTRAMUSCULAR | Status: DC | PRN
  Administered 2018-05-23 (×2): 15 mg via INTRAVENOUS
  Filled 2018-05-23 (×4): qty 1

## 2018-05-23 MED ORDER — PANTOPRAZOLE SODIUM 40 MG PO TBEC
40.0000 mg | DELAYED_RELEASE_TABLET | Freq: Two times a day (BID) | ORAL | Status: DC
  Administered 2018-05-24: 40 mg via ORAL
  Filled 2018-05-23 (×2): qty 1

## 2018-05-23 MED ORDER — SODIUM CHLORIDE 0.9 % IV BOLUS
500.0000 mL | Freq: Once | INTRAVENOUS | Status: AC
  Administered 2018-05-23: 500 mL via INTRAVENOUS

## 2018-05-23 MED ORDER — VITAMIN C 500 MG PO TABS
250.0000 mg | ORAL_TABLET | Freq: Two times a day (BID) | ORAL | Status: DC
  Administered 2018-05-24: 250 mg via ORAL
  Filled 2018-05-23 (×3): qty 0.5

## 2018-05-23 MED ORDER — AMPHETAMINE-DEXTROAMPHET ER 5 MG PO CP24
30.0000 mg | ORAL_CAPSULE | Freq: Every day | ORAL | Status: DC
  Administered 2018-05-24: 30 mg via ORAL
  Filled 2018-05-23: qty 1
  Filled 2018-05-23: qty 6

## 2018-05-23 MED ORDER — VANCOMYCIN HCL IN DEXTROSE 1-5 GM/200ML-% IV SOLN
1000.0000 mg | Freq: Once | INTRAVENOUS | Status: DC
  Filled 2018-05-23: qty 200

## 2018-05-23 MED ORDER — SODIUM CHLORIDE 0.9 % IV SOLN: 500.0000 mg | Freq: Once | INTRAVENOUS | Status: DC

## 2018-05-23 MED ORDER — SODIUM BICARBONATE 8.4 % IV SOLN
INTRAVENOUS | Status: DC
  Administered 2018-05-23 – 2018-05-24 (×2): via INTRAVENOUS
  Filled 2018-05-23 (×7): qty 150

## 2018-05-23 MED ORDER — SODIUM CHLORIDE 0.9 % IV BOLUS (SEPSIS)
1000.0000 mL | Freq: Once | INTRAVENOUS | Status: AC
  Administered 2018-05-23: 1000 mL via INTRAVENOUS

## 2018-05-23 MED ORDER — ONDANSETRON HCL 4 MG PO TABS: 4.0000 mg | ORAL_TABLET | Freq: Four times a day (QID) | ORAL | Status: DC | PRN

## 2018-05-23 MED ORDER — SODIUM CHLORIDE 0.9 % IV SOLN
2.0000 g | Freq: Once | INTRAVENOUS | Status: AC
  Administered 2018-05-23: 2 g via INTRAVENOUS
  Filled 2018-05-23: qty 2

## 2018-05-23 MED ORDER — ALBUTEROL SULFATE (2.5 MG/3ML) 0.083% IN NEBU: 2.5000 mg | INHALATION_SOLUTION | RESPIRATORY_TRACT | Status: DC | PRN

## 2018-05-23 MED ORDER — VANCOMYCIN HCL IN DEXTROSE 750-5 MG/150ML-% IV SOLN
750.0000 mg | Freq: Once | INTRAVENOUS | Status: DC
  Filled 2018-05-23: qty 150

## 2018-05-23 MED ORDER — ESCITALOPRAM OXALATE 10 MG PO TABS
20.0000 mg | ORAL_TABLET | Freq: Every day | ORAL | Status: DC
  Administered 2018-05-24: 20 mg via ORAL
  Filled 2018-05-23 (×3): qty 2

## 2018-05-23 MED ORDER — FESOTERODINE FUMARATE ER 4 MG PO TB24
4.0000 mg | ORAL_TABLET | Freq: Every day | ORAL | Status: DC
  Administered 2018-05-24: 4 mg via ORAL
  Filled 2018-05-23 (×2): qty 1

## 2018-05-23 MED ORDER — VANCOMYCIN HCL IN DEXTROSE 1-5 GM/200ML-% IV SOLN
1000.0000 mg | Freq: Once | INTRAVENOUS | Status: AC
  Administered 2018-05-23: 1000 mg via INTRAVENOUS

## 2018-05-23 MED ORDER — ONDANSETRON HCL 4 MG/2ML IJ SOLN: 4.0000 mg | Freq: Four times a day (QID) | INTRAMUSCULAR | Status: DC | PRN

## 2018-05-23 MED ORDER — FERROUS SULFATE 325 (65 FE) MG PO TBEC
325.0000 mg | DELAYED_RELEASE_TABLET | Freq: Two times a day (BID) | ORAL | Status: DC
  Filled 2018-05-23 (×3): qty 1

## 2018-05-23 MED ORDER — SODIUM CHLORIDE 0.9 % IV SOLN: 2.0000 g | Freq: Once | INTRAVENOUS | Status: DC

## 2018-05-23 MED ORDER — TRAMADOL HCL 50 MG PO TABS: 50.0000 mg | ORAL_TABLET | Freq: Four times a day (QID) | ORAL | Status: DC | PRN

## 2018-05-23 MED ORDER — BISACODYL 5 MG PO TBEC: 5.0000 mg | DELAYED_RELEASE_TABLET | Freq: Every day | ORAL | Status: DC | PRN

## 2018-05-23 MED ORDER — MAGNESIUM GLUCONATE 500 MG PO TABS
500.0000 mg | ORAL_TABLET | Freq: Two times a day (BID) | ORAL | Status: DC
  Administered 2018-05-24: 500 mg via ORAL
  Filled 2018-05-23 (×4): qty 1

## 2018-05-23 MED ORDER — SODIUM CHLORIDE 0.9 % IV SOLN
INTRAVENOUS | Status: DC
  Administered 2018-05-23: 13:00:00 via INTRAVENOUS

## 2018-05-23 MED ORDER — SODIUM CHLORIDE 0.9 % IV SOLN: INTRAVENOUS | Status: DC

## 2018-05-23 MED ORDER — DEXTROSE 5 % IV SOLN
500.0000 mg | INTRAVENOUS | Status: DC
  Administered 2018-05-24: 500 mg via INTRAVENOUS
  Filled 2018-05-23: qty 0.5

## 2018-05-23 MED ORDER — CHLORHEXIDINE GLUCONATE 0.12 % MT SOLN
15.0000 mL | Freq: Two times a day (BID) | OROMUCOSAL | Status: DC
  Administered 2018-05-23 – 2018-05-24 (×2): 15 mL via OROMUCOSAL
  Filled 2018-05-23 (×3): qty 15

## 2018-05-23 NOTE — ED Notes (Signed)
Assumed care of pt who is resting on stretcher at this time. Per verbal order from MD Schavietz, will place pt on 4lpm [place]. Pt remains 94-96% on 4lpm [place]. Pt is alert and oriented, denies any pain at this time. Will continue to monitor.

## 2018-05-23 NOTE — ED Triage Notes (Signed)
Pt arrived via ems for report of respiratory distress - when ems arrived O2 sat wa 60% - pt lips were purpe/blue and feet are black - no access at this time

## 2018-05-23 NOTE — ED Notes (Signed)
I/O cath attempt, no urine output.

## 2018-05-23 NOTE — Progress Notes (Signed)
   05/23/18 1500  Clinical Encounter Type  Visited With Family  Visit Type Initial  Referral From Nurse  Consult/Referral To Chaplain  Spiritual Encounters  Spiritual Needs Emotional   Chaplain responded to page regarding family support for patient's daughter in waiting room.  Chaplain sat with daughter Adrienne Patel, offering emotional support and a non-anxious presence.  Chaplain utilized active and reflective listening as daughter reviewed what led patient to hospital and how patient's husband (daughter's step father) had recently been hospitalized and is now back home.  Conversation around resources available to patient's daughter for support and self-care.  Chaplain received a page to which she needed to respond.  Chaplain let patient's daughter know of ongoing chaplain availability and will ask incoming on-call chaplain to follow up with patient's daughter.

## 2018-05-23 NOTE — ED Notes (Signed)
Report called to [facility], RN on floor. Pt transported up to floor by RN Jae Dire.

## 2018-05-23 NOTE — Progress Notes (Signed)
Advanced Care Plan.  Purpose of Encounter: CODE STATUS and prognosis. Parties in Attendance: The patient, her daughter and me. Patient's Decisional Capacity: Yes. Medical Story: Adrienne Patel  is a 77 y.o. female with a known history of CKD, Crohn's disease, MS, osteoporosis and overactive bladder.  The patient was recently discharged to rehab from the hospital for GI bleeding.  She was found hypoxia with oxygen saturation at 60s and unable to improve with nonrebreather.  She was found tachycardia, tachypnea and hypotension in ED.  Chest x-ray show multifocal pneumonia.  BMP show acute renal failure.  The patient is being admitted for sepsis due to multifocal pneumonia, acute respiratory failure with hypoxia due to pneumonia and acute renal failure.  I discussed with the patient and her daughter about the patient's current critical condition, poor prognosis and CODE STATUS.  The patient does not want to be resuscitated or intubated.  This is confirmed with her daughter.   Goals of Care Determinations: The patient has poor prognosis, may need palliative care if no improvement Plan:  Code Status: DNR. Time spent discussing advance care planning: 20 minutes.

## 2018-05-23 NOTE — Progress Notes (Addendum)
CODE SEPSIS - PHARMACY COMMUNICATION  **Broad Spectrum Antibiotics should be administered within 1 hour of Sepsis diagnosis**  Time Code Sepsis Called/Page Received: 5009  Antibiotics Ordered: Cefepime/Vancomycin   Time of 1st antibiotic administration: 1024 Cefepime   Additional action taken by pharmacy: antibiotics started within approved time frame.   If necessary, Name of Provider/Nurse Contacted: Erskine Squibb.    Alecea Trego L ,PharmD Clinical Pharmacist  05/23/2018  10:23 AM

## 2018-05-23 NOTE — H&P (Signed)
[facility]   PATIENT NAME: Adrienne Patel    MR#:  [id number]  DATE OF BIRTH:  09-21-1941  DATE OF ADMISSION:  05/23/2018  PRIMARY CARE PHYSICIAN: System, Pcp Not In   REQUESTING/REFERRING PHYSICIAN:   CHIEF COMPLAINT:   Chief Complaint  Patient presents with  . Respiratory Distress   Shortness of breath and cough for couple days. HISTORY OF PRESENT ILLNESS:  Adrienne Patel  is a 77 y.o. female with a known history of CKD, Crohn's disease, MS, osteoporosis and overactive bladder.  The patient was recently discharged to rehab from the hospital for GI bleeding.  She was found hypoxia with oxygen saturation at 60s and unable to improve with nonrebreather.  She was found tachycardia, tachypnea and hypotension in ED.  Chest x-ray show multifocal pneumonia.  BMP show acute renal failure.  Patient complains of shortness of breath, cough with sputum and generalized weakness. PAST MEDICAL HISTORY:   Past Medical History:  Diagnosis Date  . CKD (chronic kidney disease)   . Crohn's disease (HCC)   . Headache   . MS (multiple sclerosis) (HCC)   . Osteoporosis   . Overactive bladder     PAST SURGICAL HISTORY:   Past Surgical History:  Procedure Laterality Date  . ABDOMINAL HYSTERECTOMY    . ankle left fracture    . DDD     cervical  . ESOPHAGOGASTRODUODENOSCOPY N/A 03/29/2018   Procedure: ESOPHAGOGASTRODUODENOSCOPY (EGD);  Surgeon: Wyline Mood, MD;  Location: [facility] ENDOSCOPY;  Service: Gastroenterology;  Laterality: N/A;  . FRACTURE SURGERY    . ILEOSCOPY N/A 03/29/2018   Procedure: ILEOSCOPY THROUGH STOMA;  Surgeon: Wyline Mood, MD;  Location: [facility] ENDOSCOPY;  Service: Gastroenterology;  Laterality: N/A;  . ILEOSTOMY     1982  . TONSILLECTOMY      SOCIAL HISTORY:   Social History   Tobacco Use  . Smoking status: Former Smoker    Last attempt to quit: 03/27/1986    Years since quitting: 32.1  . Smokeless tobacco: Never Used  Substance  Use Topics  . Alcohol use: Not Currently    FAMILY HISTORY:   Family History  Problem Relation Age of Onset  . Heart disease Mother   . Hypertension Mother   . Heart disease Father   . Hypertension Father     DRUG ALLERGIES:  No Known Allergies  REVIEW OF SYSTEMS:   Review of Systems  Constitutional: Positive for malaise/fatigue. Negative for chills and fever.  HENT: Negative for sore throat.   Eyes: Negative for blurred vision and double vision.  Respiratory: Positive for cough, sputum production and shortness of breath. Negative for hemoptysis, wheezing and stridor.   Cardiovascular: Negative for chest pain, palpitations, orthopnea and leg swelling.  Gastrointestinal: Positive for nausea and vomiting. Negative for abdominal pain, blood in stool, diarrhea and melena.  Genitourinary: Negative for dysuria, flank pain and hematuria.  Musculoskeletal: Negative for back pain and joint pain.  Skin: Negative for rash.  Neurological: Negative for dizziness, sensory change, focal weakness, seizures, loss of consciousness, weakness and headaches.  Endo/Heme/Allergies: Negative for polydipsia.  Psychiatric/Behavioral: Negative for depression. The patient is nervous/anxious.     MEDICATIONS AT HOME:   Prior to Admission medications   Medication Sig Start Date End Date Taking? Authorizing Provider  acetaminophen (TYLENOL) 325 MG tablet Take 2 tablets (650 mg total) by mouth every 6 (six) hours as needed for mild pain (or Fever >/= 101). Patient taking differently: Take 650 mg  by mouth every 6 (six) hours as needed for mild pain or fever.  03/30/18  Yes Gouru, Aruna, MD  amphetamine-dextroamphetamine (ADDERALL XR) 30 MG 24 hr capsule Take 30 mg by mouth daily.   Yes [provider]  cholecalciferol (VITAMIN D-400) 400 units TABS tablet Take 800 Units by mouth daily.   Yes [provider]  cyanocobalamin (,VITAMIN B-12,) 1000 MCG/ML injection Inject 1,000 mcg into the  muscle every 30 (thirty) days. On the 18th of each month   Yes [provider]  escitalopram (LEXAPRO) 20 MG tablet Take 20 mg by mouth daily.   Yes [provider]  feeding supplement, ENSURE ENLIVE, (ENSURE ENLIVE) LIQD Take 237 mLs by mouth 3 (three) times daily between meals. 03/30/18  Yes Gouru, Deanna Artis, MD  ferrous sulfate 325 (65 FE) MG EC tablet Take 1 tablet (325 mg total) by mouth 2 times daily at 12 noon and 4 pm. 03/30/18  Yes Gouru, Aruna, MD  fesoterodine (TOVIAZ) 4 MG TB24 tablet Take 4 mg by mouth daily.   Yes [provider]  lipase/protease/amylase (CREON) 36000 UNITS CPEP capsule Take 2 capsules (72,000 Units total) by mouth 3 (three) times daily with meals. 03/30/18  Yes Gouru, Deanna Artis, MD  magnesium gluconate (MAGONATE) 500 MG tablet Take 500 mg by mouth 2 (two) times daily.   Yes [provider]  Multiple Vitamin (MULTIVITAMIN WITH MINERALS) TABS tablet Take 1 tablet by mouth daily. 03/31/18  Yes Gouru, Deanna Artis, MD  pantoprazole (PROTONIX) 40 MG tablet Take 1 tablet (40 mg total) by mouth 2 (two) times daily. 03/30/18  Yes Gouru, Aruna, MD  sucralfate (CARAFATE) 1 g tablet Take 1 tablet (1 g total) by mouth 4 (four) times daily -  with meals and at bedtime. 03/30/18  Yes Gouru, Deanna Artis, MD  traMADol (ULTRAM) 50 MG tablet Take 1-2 tablets (50-100 mg total) by mouth every 6 (six) hours as needed for moderate pain or severe pain. 03/30/18  Yes Gouru, Deanna Artis, MD  vitamin C (VITAMIN C) 250 MG tablet Take 1 tablet (250 mg total) by mouth 2 (two) times daily. 03/30/18  Yes Gouru, Aruna, MD      VITAL SIGNS:  Blood pressure (!) 101/58, pulse (!) 124, temperature 99.1 F (37.3 C), resp. rate (!) 28, height 5' (1.524 m), weight 80 lb (36.3 kg), SpO2 (!) 79 %.  PHYSICAL EXAMINATION:  Physical Exam  GENERAL:  77 y.o.-year-old patient lying in the bed with no acute distress.  Severe malnutrition. EYES: Pupils equal, round, reactive to light and accommodation. No scleral  icterus. Extraocular muscles intact.  HEENT: Head atraumatic, normocephalic.  Dry oral mucosa.   NECK:  Supple, no jugular venous distention. No thyroid enlargement, no tenderness.  LUNGS: Normal breath sounds bilaterally, no wheezing, rales,rhonchi or crepitation. No use of accessory muscles of respiration.  CARDIOVASCULAR: S1, S2 normal. No murmurs, rubs, or gallops.  ABDOMEN: Soft, nontender, nondistended. Bowel sounds present. No organomegaly or mass.  EXTREMITIES: No pedal edema, cyanosis, or clubbing.  NEUROLOGIC: Cranial nerves II through XII are intact. Muscle strength 5/5 in all extremities. Sensation intact. Gait not checked.  PSYCHIATRIC: The patient is alert and oriented x 3.  SKIN: No obvious rash, lesion, or ulcer.   LABORATORY PANEL:   CBC Recent Labs  Lab 05/23/18 0958  WBC 8.4  HGB 17.5*  HCT 52.5*  PLT 350   ------------------------------------------------------------------------------------------------------------------  Chemistries  Recent Labs  Lab 05/23/18 0958  NA 133*  K 3.5  CL 101  CO2  15*  GLUCOSE 98  BUN 59*  CREATININE 4.89*  CALCIUM 11.0*  AST 21  ALT 11  ALKPHOS 135*  BILITOT 0.6   ------------------------------------------------------------------------------------------------------------------  Cardiac Enzymes Recent Labs  Lab 05/23/18 0958  TROPONINI <0.03   ------------------------------------------------------------------------------------------------------------------  RADIOLOGY:  Dg Chest 1 View  Result Date: 05/23/2018 CLINICAL DATA:  Hypoxia, respiratory distress. EXAM: CHEST  1 VIEW COMPARISON:  Radiograph of March 27, 2018. FINDINGS: The heart size and mediastinal contours are within normal limits. Atherosclerosis of thoracic aorta is noted. No pneumothorax or pleural effusion is noted. Left lung is clear. New airspace opacities are noted throughout the right lung consistent with multifocal pneumonia. Old proximal left  humeral fracture is noted. IMPRESSION: New airspace opacities are noted throughout right lung most consistent with multifocal pneumonia. Followup PA and lateral chest X-ray is recommended in 3-4 weeks following trial of antibiotic therapy to ensure resolution and exclude underlying malignancy. Aortic Atherosclerosis (ICD10-I70.0). Electronically Signed   By: Lupita Raider, M.D.   On: 05/23/2018 10:01      IMPRESSION AND PLAN:   Sepsis due to multifocal pneumonia The patient will be admitted to medical floor. Continue sepsis protocol, cefepime and vancomycin pharmacy to dose, follow-up CBC and cultures, Robitussin as needed.  Acute respiratory failure with hypoxia due to above. The patient was on nonrebreather oxygen, changed to 4 L oxygen by nasal cannula.  Start albuterol every 6 hours, Robitussin as needed.  Lactic acidosis, due to above.  Continue treatment as above, follow-up lactic acid level.  Acute renal failure, possible due to ATN secondary to poor oral intake. Start IV fluid support, follow-up BMP, renal ultrasound and nephrology consult.  Multiple sclerosis.  Continue home medication.  All the records are reviewed and case discussed with ED provider. Management plans discussed with the patient, her daughter and they are in agreement.  CODE STATUS: DNR  TOTAL TIME TAKING CARE OF THIS PATIENT: 46 minutes.    Shaune Pollack M.D on 05/23/2018 at 11:48 AM  Between 7am to 6pm - Pager - [phone]  After 6pm go to [email or web address] - Social research officer, government  [facility]  Office  [phone]  CC: Primary care physician; System, Pcp Not In   Note: This dictation was prepared with Dragon dictation along with smaller phrase technology. Any transcriptional errors that result from this process are unin

## 2018-05-23 NOTE — Consult Note (Addendum)
Reason for Consult:Respiratory Distress Referring Physician: Dr. Nelda Bucks Adrienne Patel is an 77 y.o. female.  HPI: [name] is a 77 year old female with a past medical history remarkable for chronic renal disease, Crohn's, multiple sclerosis, osteoporosis, ileostomy, severe wasting and cachexia, upper GI bleed, recently discharged from the hospital to rehabilitation, was found to be hypoxemic with oxygen saturations in the 60s subsequently transferred to the emergency room, where she was noted to be hypotensive. She was started on nonrebreather mask and given fluid resuscitation. Pertinent studies revealed a chest x-ray showing multi lobar pneumonia on the right along with renal failure with a creatinine of 4.89, BUN 59, CO2 of 15, sodium 133, lactic acid 4.5,white count of 8.4, hemoglobin 17.5. Patient was transferred to the intensive care unit for BiPAP. On arrival she is awake, alert and communicating doing well on BiPAP without distress.  Past Medical History:  Diagnosis Date  . CKD (chronic kidney disease)   . Crohn's disease ([place])   . Headache   . MS (multiple sclerosis) ([place])   . Osteoporosis   . Overactive bladder     Past Surgical History:  Procedure Laterality Date  . ABDOMINAL HYSTERECTOMY    . ankle left fracture    . DDD     cervical  . ESOPHAGOGASTRODUODENOSCOPY N/A 03/29/2018   Procedure: ESOPHAGOGASTRODUODENOSCOPY (EGD);  Surgeon: Jonathon Bellows, MD;  Location: [facility] ENDOSCOPY;  Service: Gastroenterology;  Laterality: N/A;  . FRACTURE SURGERY    . ILEOSCOPY N/A 03/29/2018   Procedure: ILEOSCOPY THROUGH STOMA;  Surgeon: Jonathon Bellows, MD;  Location: [facility] ENDOSCOPY;  Service: Gastroenterology;  Laterality: N/A;  . [place]  . TONSILLECTOMY      Family History  Problem Relation Age of Onset  . Heart disease Mother   . Hypertension Mother   . Heart disease Father   . Hypertension Father     Social History:  reports that she quit smoking about 32 years ago. She has  never used smokeless tobacco. She reports that she drank alcohol. She reports that she has current or past drug history. Drug: Marijuana.  Allergies: No Known Allergies  Medications: I have reviewed the patient's current medications.  Results for orders placed or performed during the hospital encounter of 05/23/18 (from the past 48 hour(s))  Lactic acid, plasma     Status: Abnormal   Collection Time: 05/23/18  9:58 AM  Result Value Ref Range   Lactic Acid, Venous 4.5 (HH) 0.5 - 1.9 mmol/L    Comment: CRITICAL RESULT CALLED TO, READ BACK BY AND VERIFIED WITH JANE RYAN AT 1036 05/23/18 DAS Performed at [facility], [address] [place], [place] [address]   Comprehensive metabolic panel     Status: Abnormal   Collection Time: 05/23/18  9:58 AM  Result Value Ref Range   Sodium 133 (L) 135 - 145 mmol/L   Potassium 3.5 3.5 - 5.1 mmol/L   Chloride 101 98 - 111 mmol/L   CO2 15 (L) 22 - 32 mmol/L   Glucose, Bld 98 70 - 99 mg/dL   BUN 59 (H) 8 - 23 mg/dL   Creatinine, Ser 4.89 (H) 0.44 - 1.00 mg/dL   Calcium 11.0 (H) 8.9 - 10.3 mg/dL   Total Protein 7.2 6.5 - 8.1 g/dL   Albumin 3.6 3.5 - 5.0 g/dL   AST 21 15 - 41 U/L   ALT 11 0 - 44 U/L   Alkaline Phosphatase 135 (H) 38 - 126 U/L  Total Bilirubin 0.6 0.3 - 1.2 mg/dL   GFR calc non Af Amer 8 (L) >60 mL/min   GFR calc Af Amer 9 (L) >60 mL/min    Comment: (NOTE) The eGFR has been calculated using the CKD EPI equation. This calculation has not been validated in all clinical situations. eGFR's persistently <60 mL/min signify possible Chronic Kidney Disease.    Anion gap 17 (H) 5 - 15    Comment: Performed at [facility], [address] [place], [place] [address]  CBC with Differential/Platelet     Status: Abnormal   Collection Time: 05/23/18  9:58 AM  Result Value Ref Range   WBC 8.4 3.6 - 11.0 K/uL   RBC 5.73 (H) 3.80 - 5.20 MIL/uL   Hemoglobin 17.5 (H) 12.0 - 16.0 g/dL   HCT 52.5 (H) 35.0 - 47.0 %   MCV  91.7 80.0 - 100.0 fL   MCH 30.6 26.0 - 34.0 pg   MCHC 33.4 32.0 - 36.0 g/dL   RDW 15.1 (H) 11.5 - 14.5 %   Platelets 350 150 - 440 K/uL    Comment: PLATELET CLUMPS NOTED ON SMEAR, COUNT APPEARS ADEQUATE   Neutrophils Relative % 92 %   Neutro Abs 7.7 (H) 1.4 - 6.5 K/uL   Lymphocytes Relative 4 %   Lymphs Abs 0.4 (L) 1.0 - 3.6 K/uL   Monocytes Relative 4 %   Monocytes Absolute 0.4 0.2 - 0.9 K/uL   Eosinophils Relative 0 %   Eosinophils Absolute 0.0 0 - 0.7 K/uL   Basophils Relative 0 %   Basophils Absolute 0.0 0 - 0.1 K/uL    Comment: Performed at [facility], [address] [place], [place] [address]  Troponin I     Status: None   Collection Time: 05/23/18  9:58 AM  Result Value Ref Range   Troponin I <0.03 <0.03 ng/mL    Comment: Performed at [facility], [address] [place], [place] [address]  Blood gas, arterial     Status: Abnormal   Collection Time: 05/23/18 12:51 PM  Result Value Ref Range   FIO2 0.36    Delivery systems NASAL CANNULA    pH, Arterial 7.17 (LL) 7.350 - 7.450    Comment: CRITICAL RESULT CALLED TO, READ BACK BY AND VERIFIED WITH: DR Bridgett Larsson AT 1335 ON 05/23/2018 CW    pCO2 arterial 30 (L) 32.0 - 48.0 mmHg   pO2, Arterial 49 (L) 83.0 - 108.0 mmHg   Bicarbonate 10.9 (L) 20.0 - 28.0 mmol/L   Acid-base deficit 16.3 (H) 0.0 - 2.0 mmol/L   O2 Saturation 71.8 %   Patient temperature 37.0    Collection site RIGHT BRACHIAL    Sample type ARTERIAL DRAW    Allens test (pass/fail) PASS PASS    Comment: Performed at [facility], [address] [place], [place] [address]  Glucose, capillary     Status: None   Collection Time: 05/23/18  3:16 PM  Result Value Ref Range   Glucose-Capillary 87 70 - 99 mg/dL    Dg Chest 1 View  Result Date: 05/23/2018 CLINICAL DATA:  Hypoxia, respiratory distress. EXAM: CHEST  1 VIEW COMPARISON:  Radiograph of March 27, 2018. FINDINGS: The heart size and mediastinal contours are within normal limits.  Atherosclerosis of thoracic aorta is noted. No pneumothorax or pleural effusion is noted. Left lung is clear. New airspace opacities are noted throughout the right lung consistent with multifocal pneumonia. Old proximal left humeral fracture is noted.  IMPRESSION: New airspace opacities are noted throughout right lung most consistent with multifocal pneumonia. Followup PA and lateral chest X-ray is recommended in 3-4 weeks following trial of antibiotic therapy to ensure resolution and exclude underlying malignancy. Aortic Atherosclerosis (ICD10-I70.0). Electronically Signed   By: Marijo Conception, M.D.   On: 05/23/2018 10:01    ROS Blood pressure 94/63, pulse (!) 105, temperature (!) 97.4 F (36.3 C), temperature source Axillary, resp. rate 17, height _0  (1.676 m), weight 104 lb 4.4 oz (47.3 kg), SpO2 100 %. Physical Exam Cachectic elderly-appearing female, awake, alert and communicating Vital signs: Please see the above listed vital signs HEENT: Extensive muscle wasting noted, shoulders, supraclavicular fossa, temporal wasting, pectoral region, no oral lesions appreciated, limited exam secondary to noninvasive ventilation, minimal accessory muscle utilization noted Cardiovascular: Tachycardia,regular, sinus mechanism noted on monitor Pulmonary: Relatively clear to auscultation Abdominal: Ileostomy noted Extremities: No clubbing,edema,peripheral cyanosis is noted over both distal feet with purplish hue Neurologic: Patient moves extremities  Assessment/Plan:  Pneumonia with respiratory failure. Will support respiratory status with BiPAP and weaned to he did high flow as tolerated. Patient is on vancomycin and cefepime which is appropriate coverage and the situation. If patient is able will obtain respiratory culture. We'll also send urine for Legionella and pneumococcus  Renal failure. Patient has a history of renal insufficiency now with acute on chronic renal failure with a creatinine of 4.89  and a BUN of 59. While some of this may reflect volume status, most likely ATN secondary to infection and hypotension. At this point no acute indication for hemodialysis, nephrology has been consulted, gentle rehydration.if does not improve with rehydration may require renal ultrasound, bladder scan make sure no obstruction and possible Foley for accurate I's and O's  Acid-based derangement. Patient has an anion gap of 17, delta gap of 5, calculated starting bicarbonate of 20 consistent with both an anion gap and non-anion gap metabolic acidosis, based on Winters formula appropriate respiratory compensation noted  Hyponatremia. Most likely reflects solute deficiency saline resuscitation  Elevated hematocrit. Historically patient has been anemic and since this is acute most likely reflects contraction.  Severe protein calorie malnutrition. Patient has overwhelming cachexia  Left chronic humeral fracture  Will discuss with family goals of care and consult palliative care service  [place] 05/23/2018, 3:50 PM

## 2018-05-23 NOTE — Plan of Care (Signed)
Pt was admitted from the ED with code sepsis. According to report was hypoxic and Hypotensive on arrival.   Lactic acid was 4.5. Also has acute renal failure.  Pt was alert and responsive. Pt's fingers and feet were cyanotic and had poor pulses.  PH from ABG was 7.17.  Pt's daughter states that pt's hands and feet have been cyanotic for a while.  She has been followed at [facility] for Crohn's Disease.  Has had these episodes of decline and has steadily gotten worse.  So she brought her to [facility] for "new eyes".  Pt was put on non-rebreather.  Daughter states that she has only been home 3 weeks from being at [facility], "where she had thrived".  But pt wanted to go home where she has 24/7 care.  Pt has declined while at home.  Not eating or drinking.  I called Dr. Imogene Burn and it was decided she need to be more closely monitored in stepdown. Called report to Tiffany and we transported pt upstairs to ICU16.  Spent time with daughter until the chaplain arrived.

## 2018-05-23 NOTE — ED Notes (Signed)
Attempted to call report multiple times and spoke with charge RN Maddie and nursing supervisor regarding pt required level of care for admission. Pt tolerating nasal cannula well and MD Imogene Burn at bedside to reevaluate pt and reports pt is appropriate for 1C bed. Notified Maddie, Consulting civil engineer on floor. Awaiting for call back to give report.

## 2018-05-23 NOTE — Progress Notes (Signed)
Secure text sent to Dr. Imogene Burn about patient status to go to Med Surg.

## 2018-05-23 NOTE — Progress Notes (Addendum)
Pharmacy Antibiotic Note  Adrienne Patel is a 77 y.o. female admitted on 05/23/2018 with Sepsis/Failure to thrive. Patient with recent admission for GI bleed followed by stay in rehab. Pharmacy has been consulted for vancomycin and cefepime dosing.  Plan: Patient with significant acute renal failure (creatine 1.3 on 6/4). Will monitor serum creatinine daily and adjust as appropriate.   Patient received cefepime 2g IV X 1 in ED. Will continue cefepime 500mg  IV Q24hr with next scheduled dose at 1000 on 7/30.   Patient ordered vancomycin 1000mg  IV x 1. Based on current pharmacokinetics next vancomycin dose would be due in ~ 92 hours. Will order place holder order and re-dose with am labs on 7/30.   Height: 5' (152.4 cm) Weight: 80 lb (36.3 kg) IBW/kg (Calculated) : 45.5  Temp (24hrs), Avg:99.9 F (37.7 C), Min:99.9 F (37.7 C), Max:99.9 F (37.7 C)  Recent Labs  Lab 05/23/18 0958  WBC 8.4  CREATININE 4.89*  LATICACIDVEN 4.5*    Estimated Creatinine Clearance: 5.8 mL/min (A) (by C-G formula based on SCr of 4.89 mg/dL (H)).    No Known Allergies  Antimicrobials this admission: Cefepime 7/29 >>  Vancomycin 7/29 >>   Dose adjustments this admission: N/A  Microbiology results: 7/29 BCx: pending  7/29 UCx: pending   Thank you for allowing pharmacy to be a part of this patient's care.  Moneisha Vosler L 05/23/2018 10:41 AM

## 2018-05-23 NOTE — ED Provider Notes (Signed)
[facility] Emergency Department Provider Note ____________________________________________   First MD Initiated Contact with Patient 05/23/18 [phone]     (approximate)  I have reviewed the triage vital signs and the nursing notes.   HISTORY  Chief Complaint Respiratory Distress  HPI Adrienne Patel is a 77 y.o. female history of CKD as well as Crohn's disease and multiple sclerosis who is presenting to the emergency department today with failure to thrive.  Per EMS, she was also found to have an oxygen saturation of 60% which was unable to be improved even with a nonrebreather.  Patient says that she has diffuse body aches as well as shortness of breath.  I also spoke with the patient's daughter who is concerned about pneumonia during her recent rehab stay after discharge from the hospital for GI bleeding.  Past Medical History:  Diagnosis Date  . CKD (chronic kidney disease)   . Crohn's disease (HCC)   . Headache   . MS (multiple sclerosis) (HCC)   . Osteoporosis   . Overactive bladder     Patient Active Problem List   Diagnosis Date Noted  . GI bleed 03/29/2018  . Protein-calorie malnutrition, severe 03/28/2018  . GIB (gastrointestinal bleeding) 03/27/2018    Past Surgical History:  Procedure Laterality Date  . ABDOMINAL HYSTERECTOMY    . ankle left fracture    . DDD     cervical  . ESOPHAGOGASTRODUODENOSCOPY N/A 03/29/2018   Procedure: ESOPHAGOGASTRODUODENOSCOPY (EGD);  Surgeon: Wyline Mood, MD;  Location: [facility] ENDOSCOPY;  Service: Gastroenterology;  Laterality: N/A;  . FRACTURE SURGERY    . ILEOSCOPY N/A 03/29/2018   Procedure: ILEOSCOPY THROUGH STOMA;  Surgeon: Wyline Mood, MD;  Location: [facility] ENDOSCOPY;  Service: Gastroenterology;  Laterality: N/A;  . ILEOSTOMY     1982  . TONSILLECTOMY      Prior to Admission medications   Medication Sig Start Date End Date Taking? Authorizing Provider  acetaminophen (TYLENOL) 325 MG tablet Take 2 tablets  (650 mg total) by mouth every 6 (six) hours as needed for mild pain (or Fever >/= 101). Patient taking differently: Take 650 mg by mouth every 6 (six) hours as needed for mild pain or fever.  03/30/18  Yes Gouru, Aruna, MD  amphetamine-dextroamphetamine (ADDERALL XR) 30 MG 24 hr capsule Take 30 mg by mouth daily.   Yes [provider]  cholecalciferol (VITAMIN D-400) 400 units TABS tablet Take 800 Units by mouth daily.   Yes [provider]  cyanocobalamin (,VITAMIN B-12,) 1000 MCG/ML injection Inject 1,000 mcg into the muscle every 30 (thirty) days. On the 18th of each month   Yes [provider]  escitalopram (LEXAPRO) 20 MG tablet Take 20 mg by mouth daily.   Yes [provider]  feeding supplement, ENSURE ENLIVE, (ENSURE ENLIVE) LIQD Take 237 mLs by mouth 3 (three) times daily between meals. 03/30/18  Yes Gouru, Deanna Artis, MD  ferrous sulfate 325 (65 FE) MG EC tablet Take 1 tablet (325 mg total) by mouth 2 times daily at 12 noon and 4 pm. 03/30/18  Yes Gouru, Aruna, MD  fesoterodine (TOVIAZ) 4 MG TB24 tablet Take 4 mg by mouth daily.   Yes [provider]  lipase/protease/amylase (CREON) 36000 UNITS CPEP capsule Take 2 capsules (72,000 Units total) by mouth 3 (three) times daily with meals. 03/30/18  Yes Gouru, Deanna Artis, MD  magnesium gluconate (MAGONATE) 500 MG tablet Take 500 mg by mouth 2 (two) times daily.   Yes [provider]  Multiple Vitamin (  MULTIVITAMIN WITH MINERALS) TABS tablet Take 1 tablet by mouth daily. 03/31/18  Yes Gouru, Deanna Artis, MD  pantoprazole (PROTONIX) 40 MG tablet Take 1 tablet (40 mg total) by mouth 2 (two) times daily. 03/30/18  Yes Gouru, Aruna, MD  sucralfate (CARAFATE) 1 g tablet Take 1 tablet (1 g total) by mouth 4 (four) times daily -  with meals and at bedtime. 03/30/18  Yes Gouru, Deanna Artis, MD  traMADol (ULTRAM) 50 MG tablet Take 1-2 tablets (50-100 mg total) by mouth every 6 (six) hours as needed for moderate pain or severe pain. 03/30/18   Yes Gouru, Deanna Artis, MD  vitamin C (VITAMIN C) 250 MG tablet Take 1 tablet (250 mg total) by mouth 2 (two) times daily. 03/30/18  Yes Ramonita Lab, MD    Allergies Patient has no known allergies.  No family history on file.  Social History Social History   Tobacco Use  . Smoking status: Former Smoker    Last attempt to quit: 03/27/1986    Years since quitting: 32.1  . Smokeless tobacco: Never Used  Substance Use Topics  . Alcohol use: Not Currently  . Drug use: Yes    Types: Marijuana    Comment: "Years ago"    Review of Systems  Constitutional: No fever/chills Eyes: No visual changes. ENT: No sore throat. Cardiovascular: Denies chest pain. Respiratory: As above Gastrointestinal: No abdominal pain.  No nausea, no vomiting.  No diarrhea.  No constipation. Genitourinary: Negative for dysuria. Musculoskeletal: Diffuse body aches Skin: Negative for rash. Neurological: Negative for headaches, focal weakness or numbness.   ____________________________________________   PHYSICAL EXAM:  VITAL SIGNS: ED Triage Vitals  Enc Vitals Group     BP 05/23/18 1013 107/70     Pulse Rate 05/23/18 1013 (!) 124     Resp 05/23/18 1013 (!) 22     Temp 05/23/18 1013 99.9 F (37.7 C)     Temp Source 05/23/18 1013 Other     SpO2 05/23/18 0927 (!) 65 %     Weight 05/23/18 0928 80 lb (36.3 kg)     Height 05/23/18 0928 5' (1.524 m)     Head Circumference --      Peak Flow --      Pain Score 05/23/18 0928 5     Pain Loc --      Pain Edu? --      Excl. in GC? --     Constitutional: Alert and oriented.  Patient is cachectic and ill-appearing. Eyes: Conjunctivae are normal.  Head: Atraumatic. Nose: No congestion/rhinnorhea. Mouth/Throat: Mucous membranes are moist.  Neck: No stridor.   Cardiovascular: Tachycardic, regular rhythm. Grossly normal heart sounds.   Respiratory: Normal respiratory effort.  No retractions.  Rales to the right field diffusely. Gastrointestinal: Soft and  nontender. No distention. No CVA tenderness. Musculoskeletal: No lower extremity tenderness nor edema.  No joint effusions. Patient with left-sided deformity to the proximal humerus which the daughter reports is chronic and has been present for approximately 5 years. Neurologic:  Normal speech and language. No gross focal neurologic deficits are appreciated. Skin:  Skin is warm, dry and intact. No rash noted. Psychiatric: Mood and affect are normal. Speech and behavior are normal.  ____________________________________________   LABS (all labs ordered are listed, but only abnormal results are displayed)  Labs Reviewed  LACTIC ACID, PLASMA - Abnormal; Notable for the following components:      Result Value   Lactic Acid, Venous 4.5 (*)    All other components within  normal limits  COMPREHENSIVE METABOLIC PANEL - Abnormal; Notable for the following components:   Sodium 133 (*)    CO2 15 (*)    BUN 59 (*)    Creatinine, Ser 4.89 (*)    Calcium 11.0 (*)    Alkaline Phosphatase 135 (*)    GFR calc non Af Amer 8 (*)    GFR calc Af Amer 9 (*)    Anion gap 17 (*)    All other components within normal limits  CBC WITH DIFFERENTIAL/PLATELET - Abnormal; Notable for the following components:   RBC 5.73 (*)    Hemoglobin 17.5 (*)    HCT 52.5 (*)    RDW 15.1 (*)    Neutro Abs 7.7 (*)    Lymphs Abs 0.4 (*)    All other components within normal limits  CULTURE, BLOOD (ROUTINE X 2)  CULTURE, BLOOD (ROUTINE X 2)  URINE CULTURE  TROPONIN I  CBC WITH DIFFERENTIAL/PLATELET  LACTIC ACID, PLASMA  URINALYSIS, COMPLETE (UACMP) WITH MICROSCOPIC  URINALYSIS, ROUTINE W REFLEX MICROSCOPIC   ____________________________________________  EKG  ED ECG REPORT I, Arelia Longest, the attending physician, personally viewed and interpreted this ECG.   Date: 05/23/2018  EKG Time: 0932  Rate: 136  Rhythm: sinus tachycardia  Axis: Normal  Intervals:none  ST&T Change: No ST segment elevation or  depression.  No abnormal T wave inversion.  ____________________________________________  RADIOLOGY  Right-sided multifocal pneumonia ____________________________________________   PROCEDURES  Procedure(s) performed:   .Critical Care Performed by: Myrna Blazer, MD Authorized by: Myrna Blazer, MD   Critical care provider statement:    Critical care time (minutes):  35   Critical care time was exclusive of:  Separately billable procedures and treating other patients   Critical care was necessary to treat or prevent imminent or life-threatening deterioration of the following conditions:  Respiratory failure and sepsis   Critical care was time spent personally by me on the following activities:  Development of treatment plan with patient or surrogate, discussions with consultants, evaluation of patient's response to treatment, examination of patient, obtaining history from patient or surrogate, ordering and performing treatments and interventions, ordering and review of laboratory studies, ordering and review of radiographic studies, pulse oximetry, re-evaluation of patient's condition and review of old charts    Critical Care performed:  Angiocath insertion Performed by: Arelia Longest  Consent: Verbal consent obtained. Risks and benefits: risks, benefits and alternatives were discussed Time out: Immediately prior to procedure a "time out" was called to verify the correct patient, procedure, equipment, support staff and site/side marked as required.  Preparation: Patient was prepped and draped in the usual sterile fashion.  Vein Location: right basilic  Ultrasound Guided  Gauge: 18  Normal blood return and flush without difficulty Patient tolerance: Patient tolerated the procedure well with no immediate complications.     ____________________________________________   INITIAL IMPRESSION / ASSESSMENT AND PLAN / ED COURSE  Pertinent labs &  imaging results that were available during my care of the patient were reviewed by me and considered in my medical decision making (see chart for details).  Differential includes, but is not limited to, viral syndrome, bronchitis including COPD exacerbation, pneumonia, reactive airway disease including asthma, CHF including exacerbation with or without pulmonary/interstitial edema, pneumothorax, ACS, thoracic trauma, and pulmonary embolism. As part of my medical decision making, I reviewed the following data within the electronic MEDICAL RECORD NUMBER Old chart reviewed and Notes from prior ED visits  ----------------------------------------- 10:43  AM on 05/23/2018 -----------------------------------------  Patient placed on sepsis protocol with fluid bolus and broad-spectrum antibiotics for healthcare acquired pneumonia.  Patient to be admitted to the hospital.  Signed out to Dr. Imogene Burn.  Also found to be in acute renal failure. ____________________________________________   FINAL CLINICAL IMPRESSION(S) / ED DIAGNOSES  Acute renal failure.  Sepsis.  H CAP.    NEW MEDICATIONS STARTED DURING THIS VISIT:  New Prescriptions   No medications on file     Note:  This document was prepared using Dragon voice recognition software and may include unintentional dictation errors.     Myrna Blazer, MD 05/23/18 1044

## 2018-05-23 NOTE — Consult Note (Signed)
[facility] CONSULT NOTE    Date: 05/23/2018                  Patient Name:  Adrienne Patel  MRN: [id number]  DOB: 1941/07/31  Age / Sex: 77 y.o., female         PCP: System, Pcp Not In                 Service Requesting Consult: Critical Care                 Reason for Consult: Acute renal failure            History of Present Illness: Patient is a 77 y.o. female with a PMHx of chronic kidney disease stage III, Crohn's disease, multiple sclerosis, osteoporosis, overactive bladder, who was admitted to [facility] on 05/23/2018 for evaluation of respiratory distress and acute respiratory failure due to multifocal pneumonia.  She is unable to provide any history at this time as she's on bipap.  Shes noted as being hypotensive in the CCU.  We are asked to see her for acute renal failure.  Baseline Cr is 1.33 with an EGFR of 38.  Currently Cr is 4.89 with a BUN of 59.  Metabolic acidosis is also present with serum bicarb of 15. Patient also noted to be hyponatremic with a serum sodium of 133.  Patient also has evidence of a UTI.  Patient is DNR status at the moment.    Medications: Outpatient medications: Medications Prior to Admission  Medication Sig Dispense Refill Last Dose  . acetaminophen (TYLENOL) 325 MG tablet Take 2 tablets (650 mg total) by mouth every 6 (six) hours as needed for mild pain (or Fever >/= 101). (Patient taking differently: Take 650 mg by mouth every 6 (six) hours as needed for mild pain or fever. )   PRN at PRN  . amphetamine-dextroamphetamine (ADDERALL XR) 30 MG 24 hr capsule Take 30 mg by mouth daily.   unknown at unknown  . cholecalciferol (VITAMIN D-400) 400 units TABS tablet Take 800 Units by mouth daily.   unknown at unknown  . cyanocobalamin (,VITAMIN B-12,) 1000 MCG/ML injection Inject 1,000 mcg into the muscle every 30 (thirty) days. On the 18th of each month   unknown at unknown  . escitalopram (LEXAPRO) 20 MG tablet Take 20 mg by mouth daily.    unknown at unknown  . feeding supplement, ENSURE ENLIVE, (ENSURE ENLIVE) LIQD Take 237 mLs by mouth 3 (three) times daily between meals. 237 mL 12   . ferrous sulfate 325 (65 FE) MG EC tablet Take 1 tablet (325 mg total) by mouth 2 times daily at 12 noon and 4 pm.  3 unknown at unknown  . fesoterodine (TOVIAZ) 4 MG TB24 tablet Take 4 mg by mouth daily.   unknown at unknown  . lipase/protease/amylase (CREON) 36000 UNITS CPEP capsule Take 2 capsules (72,000 Units total) by mouth 3 (three) times daily with meals. 270 capsule  unknown at unknown  . magnesium gluconate (MAGONATE) 500 MG tablet Take 500 mg by mouth 2 (two) times daily.   unknown at unknown  . Multiple Vitamin (MULTIVITAMIN WITH MINERALS) TABS tablet Take 1 tablet by mouth daily.   unknown at unknown  . pantoprazole (PROTONIX) 40 MG tablet Take 1 tablet (40 mg total) by mouth 2 (two) times daily.   unknown at unknown  . sucralfate (CARAFATE) 1 g tablet Take 1 tablet (1 g total) by mouth 4 (four) times daily -  with meals and at bedtime.   unknown at unknown  . traMADol (ULTRAM) 50 MG tablet Take 1-2 tablets (50-100 mg total) by mouth every 6 (six) hours as needed for moderate pain or severe pain. 30 tablet 0 PRN at PRN  . vitamin C (VITAMIN C) 250 MG tablet Take 1 tablet (250 mg total) by mouth 2 (two) times daily.   unknown at unknown    Current medications: Current Facility-Administered Medications  Medication Dose Route Frequency Provider Last Rate Last Dose  . 0.9 %  sodium chloride infusion   Intravenous Continuous Demetrios Loll, MD 125 mL/hr at 05/23/18 1302    . acetaminophen (TYLENOL) tablet 650 mg  650 mg Oral Q6H PRN Demetrios Loll, MD       Or  . acetaminophen (TYLENOL) suppository 650 mg  650 mg Rectal Q6H PRN Demetrios Loll, MD      . albuterol (PROVENTIL) (2.5 MG/3ML) 0.083% nebulizer solution 2.5 mg  2.5 mg Nebulization Q2H PRN Demetrios Loll, MD      . albuterol (PROVENTIL) (2.5 MG/3ML) 0.083% nebulizer solution 2.5 mg  2.5 mg  Nebulization Q6H Demetrios Loll, MD   2.5 mg at 05/23/18 1424  . amphetamine-dextroamphetamine (ADDERALL XR) 24 hr capsule 30 mg  30 mg Oral Daily Demetrios Loll, MD      . bisacodyl (DULCOLAX) EC tablet 5 mg  5 mg Oral Daily PRN Demetrios Loll, MD      . Derrill Memo ON 05/24/2018] ceFEPIme (MAXIPIME) 500 mg in dextrose 5 % 50 mL IVPB  500 mg Intravenous Q24H Demetrios Loll, MD      . cholecalciferol (VITAMIN D) tablet 800 Units  800 Units Oral Daily Demetrios Loll, MD      . escitalopram (LEXAPRO) tablet 20 mg  20 mg Oral Daily Demetrios Loll, MD      . feeding supplement (ENSURE ENLIVE) (ENSURE ENLIVE) liquid 237 mL  237 mL Oral TID BM Demetrios Loll, MD      . ferrous sulfate EC tablet 325 mg  325 mg Oral q12n4p Demetrios Loll, MD      . fesoterodine (TOVIAZ) tablet 4 mg  4 mg Oral Daily Demetrios Loll, MD      . guaiFENesin (ROBITUSSIN) 100 MG/5ML solution 100 mg  5 mL Oral Q4H PRN Demetrios Loll, MD      . heparin injection 5,000 Units  5,000 Units Subcutaneous Q8H Demetrios Loll, MD      . ketorolac (TORADOL) 15 MG/ML injection 15 mg  15 mg Intravenous Q6H PRN Demetrios Loll, MD   15 mg at 05/23/18 1309  . lipase/protease/amylase (CREON) capsule 72,000 Units  72,000 Units Oral TID WC Demetrios Loll, MD      . magnesium gluconate (MAGONATE) tablet 500 mg  500 mg Oral BID Demetrios Loll, MD      . Derrill Memo ON 05/24/2018] multivitamin with minerals tablet 1 tablet  1 tablet Oral Daily Demetrios Loll, MD      . ondansetron [facility]) tablet 4 mg  4 mg Oral Q6H PRN Demetrios Loll, MD       Or  . ondansetron [facility]) injection 4 mg  4 mg Intravenous Q6H PRN Demetrios Loll, MD      . pantoprazole (PROTONIX) EC tablet 40 mg  40 mg Oral BID Demetrios Loll, MD      . senna-docusate (Senokot-S) tablet 1 tablet  1 tablet Oral QHS PRN Demetrios Loll, MD      . sucralfate (CARAFATE) tablet 1 g  1 g Oral TID WC &  HS Demetrios Loll, MD      . traMADol [facility]) tablet 50 mg  50 mg Oral Q6H PRN Demetrios Loll, MD      . Derrill Memo ON 05/28/2018] vancomycin (VANCOCIN) 500 mg in sodium chloride 0.9 % 100 mL  IVPB  500 mg Intravenous Once Demetrios Loll, MD      . vitamin C (ASCORBIC ACID) tablet 250 mg  250 mg Oral BID Demetrios Loll, MD          Allergies: No Known Allergies    Past Medical History: Past Medical History:  Diagnosis Date  . CKD (chronic kidney disease)   . Crohn's disease ([place])   . Headache   . MS (multiple sclerosis) ([place])   . Osteoporosis   . Overactive bladder      Past Surgical History: Past Surgical History:  Procedure Laterality Date  . ABDOMINAL HYSTERECTOMY    . ankle left fracture    . DDD     cervical  . ESOPHAGOGASTRODUODENOSCOPY N/A 03/29/2018   Procedure: ESOPHAGOGASTRODUODENOSCOPY (EGD);  Surgeon: Jonathon Bellows, MD;  Location: [facility] ENDOSCOPY;  Service: Gastroenterology;  Laterality: N/A;  . FRACTURE SURGERY    . ILEOSCOPY N/A 03/29/2018   Procedure: ILEOSCOPY THROUGH STOMA;  Surgeon: Jonathon Bellows, MD;  Location: [facility] ENDOSCOPY;  Service: Gastroenterology;  Laterality: N/A;  . [place]  . TONSILLECTOMY       Family History: Family History  Problem Relation Age of Onset  . Heart disease Mother   . Hypertension Mother   . Heart disease Father   . Hypertension Father      Social History: Social History   Socioeconomic History  . Marital status: Married    Spouse name: Not on file  . Number of children: Not on file  . Years of education: Not on file  . Highest education level: Not on file  Occupational History  . Not on file  Social Needs  . Financial resource strain: Not on file  . Food insecurity:    Worry: Not on file    Inability: Not on file  . Transportation needs:    Medical: Not on file    Non-medical: Not on file  Tobacco Use  . Smoking status: Former Smoker    Last attempt to quit: 03/27/1986    Years since quitting: 32.1  . Smokeless tobacco: Never Used  Substance and Sexual Activity  . Alcohol use: Not Currently  . Drug use: Yes    Types: Marijuana    Comment: "Years ago"  . Sexual activity: Not on file   Lifestyle  . Physical activity:    Days per week: Not on file    Minutes per session: Not on file  . Stress: Not on file  Relationships  . Social connections:    Talks on phone: Not on file    Gets together: Not on file    Attends religious service: Not on file    Active member of club or organization: Not on file    Attends meetings of clubs or organizations: Not on file    Relationship status: Not on file  . Intimate partner violence:    Fear of current or ex partner: Not on file    Emotionally abused: Not on file    Physically abused: Not on file    Forced sexual activity: Not on file  Other Topics Concern  . Not on file  Social History Narrative  . Not on file  Review of Systems: Patient cannot provide ROS as shes on bipap and has altered mental status.  Vital Signs: Blood pressure (!) 158/137, pulse (!) 102, temperature (!) 97.4 F (36.3 C), temperature source Axillary, resp. rate 16, height 5' 6"  (1.676 m), weight 47.3 kg (104 lb 4.4 oz), SpO2 99 %.  Weight trends: Filed Weights   05/23/18 0928 05/23/18 1503  Weight: 36.3 kg (80 lb) 47.3 kg (104 lb 4.4 oz)    Physical Exam: General: Critically ill appearing   Head: Normocephalic, atraumatic.  Eyes: Anicteric  Nose: Mucous membranes moist, not inflammed, nonerythematous.  Throat: Bipap facemask on, unable to visualize oropharynx.   Neck: Supple, trachea midline.  Lungs:  On bipap, scattered rales  Heart: S1S2 no rubs  Abdomen:  BS normoactive. Soft, Nondistended, non-tender.  No masses or organomegaly.  Extremities: No pretibial edema.  Neurologic: Lethargic but arousable  Skin: No visible rashes, scars.    Lab results: Basic Metabolic Panel: Recent Labs  Lab 05/23/18 0958  NA 133*  K 3.5  CL 101  CO2 15*  GLUCOSE 98  BUN 59*  CREATININE 4.89*  CALCIUM 11.0*    Liver Function Tests: Recent Labs  Lab 05/23/18 0958  AST 21  ALT 11  ALKPHOS 135*  BILITOT 0.6  PROT 7.2  ALBUMIN 3.6    No results for input(s): LIPASE, AMYLASE in the last 168 hours. No results for input(s): AMMONIA in the last 168 hours.  CBC: Recent Labs  Lab 05/23/18 0958  WBC 8.4  NEUTROABS 7.7*  HGB 17.5*  HCT 52.5*  MCV 91.7  PLT 350    Cardiac Enzymes: Recent Labs  Lab 05/23/18 0958  TROPONINI <0.03    BNP: Invalid input(s): POCBNP  CBG: Recent Labs  Lab 05/23/18 7494  WHQPRF 16    Microbiology: Results for orders placed or performed during the hospital encounter of 04/03/18  Urine Culture     Status: Abnormal   Collection Time: 04/03/18  8:00 AM  Result Value Ref Range Status   Specimen Description   Final    URINE, RANDOM Performed at [facility], [address], [place], [place] [address]    Special Requests   Final    NONE Performed at [facility], [address], [place], [place] [address]    Culture (A)  Final    <10,000 COLONIES/mL INSIGNIFICANT GROWTH Performed at [facility], [place] [address], [place], [place] [address]    Report Status 04/04/2018 FINAL  Final    Coagulation Studies: No results for input(s): LABPROT, INR in the last 72 hours.  Urinalysis: No results for input(s): COLORURINE, LABSPEC, PHURINE, GLUCOSEU, HGBUR, BILIRUBINUR, KETONESUR, PROTEINUR, UROBILINOGEN, NITRITE, LEUKOCYTESUR in the last 72 hours.  Invalid input(s): APPERANCEUR    Imaging: Dg Chest 1 View  Result Date: 05/23/2018 CLINICAL DATA:  Hypoxia, respiratory distress. EXAM: CHEST  1 VIEW COMPARISON:  Radiograph of March 27, 2018. FINDINGS: The heart size and mediastinal contours are within normal limits. Atherosclerosis of thoracic aorta is noted. No pneumothorax or pleural effusion is noted. Left lung is clear. New airspace opacities are noted throughout the right lung consistent with multifocal pneumonia. Old proximal left humeral fracture is noted. IMPRESSION: New airspace opacities are noted throughout right lung most consistent with multifocal  pneumonia. Followup PA and lateral chest X-ray is recommended in 3-4 weeks following trial of antibiotic therapy to ensure resolution and exclude underlying malignancy. Aortic Atherosclerosis (ICD10-I70.0). Electronically Signed   By: Marijo Conception,  M.D.   On: 05/23/2018 10:01      Assessment & Plan: Pt is a 77 y.o. female with a PMHx of chronic kidney disease stage III, Crohn's disease, multiple sclerosis, osteoporosis, overactive bladder, who was admitted to [facility] on 05/23/2018 for evaluation of respiratory distress and acute respiratory failure due to multifocal pneumonia.   1.  Acute renal failure secondary to hypotension, pneumonia 2.  CKD stage III baseline EGFR 38. 3.  Acute respiratory failure 4.  Metabolic acidosis.  5.  Multifocal pneumonia with sepsis. 6.  Hypotension.   Plan:  The patient is critically ill at this time.  We are asked to see her for mangement of acute renal failure.  Suspect acute renal failure due to acute illness.  Recommend checking renal US to make sure theres no underlying obstruction.  No urgent indication to perform dialysis but this may need to be considered if family desires aggressive care after she has been volume resuscitated.   D/C NS in favor of sodium bicarbonate with 3 amps of bicarbonate at 125cc/hr.  Agree with cefepime and vancomycin at this time to treat pneumonia.  Further plan as patient progresses, thanks for consultation.

## 2018-05-24 ENCOUNTER — Inpatient Hospital Stay: Payer: Medicare Other

## 2018-05-24 DIAGNOSIS — A419 Sepsis, unspecified organism: Principal | ICD-10-CM

## 2018-05-24 DIAGNOSIS — J189 Pneumonia, unspecified organism: Secondary | ICD-10-CM

## 2018-05-24 DIAGNOSIS — L8990 Pressure ulcer of unspecified site, unspecified stage: Secondary | ICD-10-CM

## 2018-05-24 DIAGNOSIS — Z515 Encounter for palliative care: Secondary | ICD-10-CM

## 2018-05-24 DIAGNOSIS — N179 Acute kidney failure, unspecified: Secondary | ICD-10-CM

## 2018-05-24 DIAGNOSIS — Z7189 Other specified counseling: Secondary | ICD-10-CM

## 2018-05-24 LAB — BLOOD GAS, ARTERIAL
Acid-base deficit: 4.8 mmol/L — ABNORMAL HIGH (ref 0.0–2.0)
BICARBONATE: 19.4 mmol/L — AB (ref 20.0–28.0)
FIO2: 0.36
O2 SAT: 93.1 %
PATIENT TEMPERATURE: 37
PCO2 ART: 32 mmHg (ref 32.0–48.0)
PH ART: 7.39 (ref 7.350–7.450)
PO2 ART: 68 mmHg — AB (ref 83.0–108.0)

## 2018-05-24 LAB — CREATININE, SERUM
CREATININE: 4.28 mg/dL — AB (ref 0.44–1.00)
GFR, EST AFRICAN AMERICAN: 11 mL/min — AB (ref >60)
GFR, EST NON AFRICAN AMERICAN: 9 mL/min — AB (ref >60)

## 2018-05-24 LAB — CBC
HEMATOCRIT: 31.9 % — AB (ref 35.0–47.0)
HEMOGLOBIN: 10.8 g/dL — AB (ref 12.0–16.0)
MCH: 30.4 pg (ref 26.0–34.0)
MCHC: 33.8 g/dL (ref 32.0–36.0)
MCV: 90.1 fL (ref 80.0–100.0)
Platelets: 208 10*3/uL (ref 150–440)
RBC: 3.54 MIL/uL — ABNORMAL LOW (ref 3.80–5.20)
RDW: 15 % — ABNORMAL HIGH (ref 11.5–14.5)
WBC: 10.9 10*3/uL (ref 3.6–11.0)

## 2018-05-24 LAB — BASIC METABOLIC PANEL
Anion gap: 5 (ref 5–15)
BUN: 59 mg/dL — AB (ref 8–23)
CHLORIDE: 110 mmol/L (ref 98–111)
CO2: 22 mmol/L (ref 22–32)
Calcium: 8.2 mg/dL — ABNORMAL LOW (ref 8.9–10.3)
Creatinine, Ser: 4 mg/dL — ABNORMAL HIGH (ref 0.44–1.00)
GFR calc Af Amer: 12 mL/min — ABNORMAL LOW (ref >60)
GFR calc non Af Amer: 10 mL/min — ABNORMAL LOW (ref >60)
GLUCOSE: 164 mg/dL — AB (ref 70–99)
POTASSIUM: 3.5 mmol/L (ref 3.5–5.1)
Sodium: 137 mmol/L (ref 135–145)

## 2018-05-24 LAB — MAGNESIUM: MAGNESIUM: 1.5 mg/dL — AB (ref 1.7–2.4)

## 2018-05-24 LAB — PROCALCITONIN
PROCALCITONIN: 14.57 ng/mL
Procalcitonin: 13.93 ng/mL

## 2018-05-24 LAB — PROTIME-INR
INR: 1.4
Prothrombin Time: 17 seconds — ABNORMAL HIGH (ref 11.4–15.2)

## 2018-05-24 LAB — APTT: aPTT: 55 seconds — ABNORMAL HIGH (ref 24–36)

## 2018-05-24 LAB — LACTIC ACID, PLASMA: Lactic Acid, Venous: 1.9 mmol/L (ref 0.5–1.9)

## 2018-05-24 MED ORDER — BOOST / RESOURCE BREEZE PO LIQD CUSTOM
1.0000 | Freq: Three times a day (TID) | ORAL | Status: DC
  Administered 2018-05-24 (×2): 1 via ORAL

## 2018-05-24 MED ORDER — LORAZEPAM 2 MG/ML IJ SOLN: 1.0000 mg | INTRAMUSCULAR | Status: DC | PRN

## 2018-05-24 MED ORDER — HYDROMORPHONE HCL 1 MG/ML IJ SOLN
0.5000 mg | INTRAMUSCULAR | Status: DC | PRN
  Administered 2018-05-24: 0.5 mg via INTRAVENOUS
  Filled 2018-05-24: qty 1

## 2018-05-24 MED ORDER — OXYCODONE HCL 5 MG/5ML PO SOLN
5.0000 mg | ORAL | Status: DC | PRN
  Administered 2018-05-24 – 2018-05-25 (×4): 5 mg via ORAL
  Filled 2018-05-24 (×4): qty 5

## 2018-05-24 MED ORDER — PHENYLEPHRINE HCL 10 MG/ML IJ SOLN
0.0000 ug/min | INTRAMUSCULAR | Status: DC
  Administered 2018-05-24: 35 ug/min via INTRAVENOUS
  Filled 2018-05-24 (×2): qty 1

## 2018-05-24 MED ORDER — MORPHINE SULFATE (PF) 2 MG/ML IV SOLN
2.0000 mg | INTRAVENOUS | Status: DC | PRN
Start: 2018-05-24 — End: 2018-05-25
  Administered 2018-05-24 – 2018-05-25 (×3): 2 mg via INTRAVENOUS
  Filled 2018-05-24 (×3): qty 1

## 2018-05-24 MED ORDER — PHENYLEPHRINE HCL-NACL 10-0.9 MG/250ML-% IV SOLN
0.0000 ug/min | INTRAVENOUS | Status: DC
  Administered 2018-05-24: 20 ug/min via INTRAVENOUS
  Administered 2018-05-24: 30 ug/min via INTRAVENOUS
  Filled 2018-05-24 (×4): qty 250

## 2018-05-24 MED ORDER — GLYCOPYRROLATE 0.2 MG/ML IJ SOLN
0.2000 mg | INTRAMUSCULAR | Status: DC | PRN
  Filled 2018-05-24: qty 1

## 2018-05-24 MED ORDER — OXYCODONE HCL 5 MG/5ML PO SOLN
5.0000 mg | ORAL | Status: DC
  Administered 2018-05-24 – 2018-05-25 (×2): 5 mg via ORAL
  Filled 2018-05-24 (×2): qty 5

## 2018-05-24 MED ORDER — HYDROCORTISONE NA SUCCINATE PF 100 MG IJ SOLR
100.0000 mg | Freq: Three times a day (TID) | INTRAMUSCULAR | Status: DC
  Administered 2018-05-24: 100 mg via INTRAVENOUS
  Filled 2018-05-24: qty 2

## 2018-05-24 NOTE — Progress Notes (Signed)
During rounds Dr. Lonn Georgia ordered to titrate neo to keep MAP above 60.

## 2018-05-24 NOTE — Progress Notes (Signed)
Follow up - Critical Care Medicine Note  Patient Details:    Adrienne Patel is an 77 y.o. female  with a past medical history remarkable for chronic renal disease, Crohn's, multiple sclerosis, osteoporosis, ileostomy, severe wasting and cachexia, upper GI bleed, recently discharged from the hospital to rehabilitation, was found to be hypoxemic with oxygen saturations in the 60s, Chest x-ray showing multi lobar pneumonia on the right along with renal failure with a creatinine of 4.89, BUN 59. Patient was transferred to the intensive care unit for BiPAP.    Lines, Airways, Drains: Ileostomy Standard (end) (Active)  Ostomy Pouch 2 piece 05/23/2018  7:27 PM  Stoma Assessment Red 05/23/2018  7:27 PM  Peristomal Assessment Intact 05/23/2018  7:27 PM     Ileostomy Standard (end) RLQ (Active)     Urethral Catheter Marcha Solders, RN Double-lumen;Straight-tip 14 Fr. (Active)  Indication for Insertion or Continuance of Catheter Unstable critical patients (first 24-48 hours) 05/24/2018  8:00 AM  Output (mL) 40 mL 05/24/2018  6:00 AM    Anti-infectives:  Anti-infectives (From admission, onward)   Start     Dose/Rate Route Frequency Ordered Stop   05/28/18 1000  vancomycin (VANCOCIN) 500 mg in sodium chloride 0.9 % 100 mL IVPB     500 mg 100 mL/hr over 60 Minutes Intravenous  Once 05/23/18 1058     05/24/18 1000  ceFEPIme (MAXIPIME) 500 mg in dextrose 5 % 50 mL IVPB     500 mg 100 mL/hr over 30 Minutes Intravenous Every 24 hours 05/23/18 1058     05/23/18 1045  vancomycin (VANCOCIN) IVPB 750 mg/150 ml premix  Status:  Discontinued     750 mg 150 mL/hr over 60 Minutes Intravenous  Once 05/23/18 1034 05/23/18 1058   05/23/18 1045  ceFEPIme (MAXIPIME) 2 g in sodium chloride 0.9 % 100 mL IVPB  Status:  Discontinued     2 g 200 mL/hr over 30 Minutes Intravenous  Once 05/23/18 1044 05/23/18 1045   05/23/18 1045  vancomycin (VANCOCIN) IVPB 1000 mg/200 mL premix     1,000 mg 200 mL/hr over 60 Minutes  Intravenous  Once 05/23/18 1044 05/23/18 1201   05/23/18 1030  ceFEPIme (MAXIPIME) 2 g in sodium chloride 0.9 % 100 mL IVPB     2 g 200 mL/hr over 30 Minutes Intravenous  Once 05/23/18 1015 05/23/18 1053   05/23/18 1030  vancomycin (VANCOCIN) IVPB 1000 mg/200 mL premix  Status:  Discontinued     1,000 mg 200 mL/hr over 60 Minutes Intravenous  Once 05/23/18 1015 05/23/18 1034      Microbiology: Results for orders placed or performed during the hospital encounter of 05/23/18  MRSA PCR Screening     Status: None   Collection Time: 05/23/18  9:25 AM  Result Value Ref Range Status   MRSA by PCR NEGATIVE NEGATIVE Final    Comment:        The GeneXpert MRSA Assay (FDA approved for NASAL specimens only), is one component of a comprehensive MRSA colonization surveillance program. It is not intended to diagnose MRSA infection nor to guide or monitor treatment for MRSA infections. Performed at [facility], [address] [place], [place] [address]   Blood culture (routine x 2)     Status: None (Preliminary result)   Collection Time: 05/23/18  9:58 AM  Result Value Ref Range Status   Specimen Description BLOOD RIGHT ARM  Final   Special Requests   Final    BOTTLES DRAWN AEROBIC  AND ANAEROBIC Blood Culture adequate volume   Culture   Final    NO GROWTH < 24 HOURS Performed at [facility], [address] [place], [place] [address]    Report Status PENDING  Incomplete  Blood culture (routine x 2)     Status: None (Preliminary result)   Collection Time: 05/23/18  2:01 PM  Result Value Ref Range Status   Specimen Description BLOOD RIGHT HAND  Final   Special Requests   Final    BOTTLES DRAWN AEROBIC AND ANAEROBIC Blood Culture results may not be optimal due to an inadequate volume of blood received in culture bottles   Culture   Final    NO GROWTH < 24 HOURS Performed at [facility], [address] [place], [place] [address]    Report Status PENDING   Incomplete    Studies: Dg Chest 1 View  Result Date: 05/23/2018 CLINICAL DATA:  Hypoxia, respiratory distress. EXAM: CHEST  1 VIEW COMPARISON:  Radiograph of March 27, 2018. FINDINGS: The heart size and mediastinal contours are within normal limits. Atherosclerosis of thoracic aorta is noted. No pneumothorax or pleural effusion is noted. Left lung is clear. New airspace opacities are noted throughout the right lung consistent with multifocal pneumonia. Old proximal left humeral fracture is noted. IMPRESSION: New airspace opacities are noted throughout right lung most consistent with multifocal pneumonia. Followup PA and lateral chest X-ray is recommended in 3-4 weeks following trial of antibiotic therapy to ensure resolution and exclude underlying malignancy. Aortic Atherosclerosis (ICD10-I70.0). Electronically Signed   By: Lupita Raider, M.D.   On: 05/23/2018 10:01    Consults: Treatment Team:  Mady Haagensen, MD   Subjective:    Overnight Issues: patient was successfully weaned off of BiPAP last evening,presently resting comfortably on nasal cannula.presently on low-dose Neo-Synephrine.  Objective:  Vital signs for last 24 hours: Temp:  [97.4 F (36.3 C)-100.2 F (37.9 C)] 98.3 F (36.8 C) (07/30 0750) Pulse Rate:  [87-124] 92 (07/30 0800) Resp:  [12-28] 15 (07/30 0800) BP: (74-158)/(43-137) 101/53 (07/30 0800) SpO2:  [65 %-100 %] 100 % (07/30 0800) FiO2 (%):  [40 %] 40 % (07/29 1930) Weight:  [80 lb (36.3 kg)-104 lb 4.4 oz (47.3 kg)] 104 lb 4.4 oz (47.3 kg) (07/29 1503)  Hemodynamic parameters for last 24 hours:    Intake/Output from previous day: 07/29 0701 - 07/30 0700 In: 2428.3 [I.V.:2328.3; IV Piggyback:100] Out: 190 [Urine:190]  Intake/Output this shift: Total I/O In: 340 [I.V.:340] Out: -   Vent settings for last 24 hours: FiO2 (%):  [40 %] 40 %  Physical Exam:   Cachectic elderly-appearing female, awake, alert and communicating Vital signs:       Please  see the above listed vital signs HEENT:           Extensive muscle wasting noted, shoulders, supraclavicular fossa, temporal wasting, pectoral region, no oral lesions appreciated, limited exam secondary to noninvasive ventilation, minimal accessory muscle utilization noted Cardiovascular:           Tachycardia,regular, sinus mechanism noted on monitor Pulmonary:      Relatively clear to auscultation Abdominal:      Ileostomy noted Extremities:     No clubbing,edema,peripheral cyanosis is noted over both distal feet with purplish hue Neurologic:      Patient moves extremities    Assessment/Plan:   Pneumonia with respiratory failure. Patient has been successfully weaned off of BiPAP presently on nasal cannula. Is empirically on vancomycin and cefepime  Renal failure. This morning creatinine has decreased from 4.8924 with rehydration. Acidosis is stable in no hyperkalemia. At this point no acute indication for hemodialysis.  Hypotension. Patient with septic shock on low-dose Neo-Synephrine. We'll continue fluid resuscitation and add Solu-Cortef  Hyponatremia. Resolved  Elevated hematocrit. Historically patient has been anemic and since this is acute most likely reflects contraction.  Severe protein calorie malnutrition. Patient has overwhelming cachexia  Left chronic humeral fracture  consult palliative care service Pending   Critical care time 35 minutes  Jerryl Holzhauer 05/24/2018  *Care during the described time interval was provided by me and/or other providers on the critical care team.  I have reviewed this patient's available data, including medical history, events of note, physical examination and test results as part of my evaluation.

## 2018-05-24 NOTE — Progress Notes (Signed)
[facility] - [place] at [facility]   PATIENT NAME: Adrienne Patel    MR#:  [id number]  DATE OF BIRTH:  [birth year]  SUBJECTIVE:  CHIEF COMPLAINT:   Chief Complaint  Patient presents with  . Respiratory Distress  Patient seen today Weaned off bipap On oxygen via nasal canula Has low urine out put  REVIEW OF SYSTEMS:    ROS  CONSTITUTIONAL: No documented fever. Has fatigue, weakness. No weight gain, no weight loss.  EYES: No blurry or double vision.  ENT: No tinnitus. No postnasal drip. No redness of the oropharynx.  RESPIRATORY: No cough, no wheeze, no hemoptysis. No dyspnea.  CARDIOVASCULAR: No chest pain. No orthopnea. No palpitations. No syncope.  GASTROINTESTINAL: No nausea, no vomiting or diarrhea. No abdominal pain. No melena or hematochezia.  GENITOURINARY: No dysuria or hematuria.  ENDOCRINE: No polyuria or nocturia. No heat or cold intolerance.  HEMATOLOGY: No anemia. No bruising. No bleeding.  INTEGUMENTARY: No rashes. No lesions.  MUSCULOSKELETAL: No arthritis. No swelling. No gout.  NEUROLOGIC: No numbness, tingling, or ataxia. No seizure-type activity.  PSYCHIATRIC: No anxiety. No insomnia. No ADD.   DRUG ALLERGIES:  No Known Allergies  VITALS:  Blood pressure (!) 91/48, pulse (!) 114, temperature 98.3 F (36.8 C), resp. rate (!) 22, height 5\' 6"  (1.676 m), weight 47.3 kg (104 lb 4.4 oz), SpO2 100 %.  PHYSICAL EXAMINATION:   Physical Exam  GENERAL:  78 y.o.-year-old patient lying in the bed with no acute distress.  EYES: Pupils equal, round, reactive to light and accommodation. No scleral icterus. Extraocular muscles intact.  HEENT: Head atraumatic, normocephalic. Oropharynx and nasopharynx clear.  NECK:  Supple, no jugular venous distention. No thyroid enlargement, no tenderness.  LUNGS: Decreased breath sounds bilaterally, bilateral rales heard. No use of accessory muscles of respiration.  CARDIOVASCULAR: S1, S2 normal. No murmurs, rubs,  or gallops.  ABDOMEN: Soft, nontender, nondistended. Bowel sounds present. No organomegaly or mass.  EXTREMITIES: No cyanosis, clubbing or edema b/l.    NEUROLOGIC: Cranial nerves II through XII are intact. No focal Motor or sensory deficits b/l.   PSYCHIATRIC: The patient is alert and oriented x 3.  SKIN: No obvious rash, lesion, or ulcer.   LABORATORY PANEL:   CBC Recent Labs  Lab 05/24/18 0436  WBC 10.9  HGB 10.8*  HCT 31.9*  PLT 208   ------------------------------------------------------------------------------------------------------------------ Chemistries  Recent Labs  Lab 05/23/18 0958 05/23/18 2336 05/24/18 0436  NA 133*  --  137  K 3.5  --  3.5  CL 101  --  110  CO2 15*  --  22  GLUCOSE 98  --  164*  BUN 59*  --  59*  CREATININE 4.89* 4.28* 4.00*  CALCIUM 11.0*  --  8.2*  MG  --  1.5*  --   AST 21  --   --   ALT 11  --   --   ALKPHOS 135*  --   --   BILITOT 0.6  --   --    ------------------------------------------------------------------------------------------------------------------  Cardiac Enzymes Recent Labs  Lab 05/23/18 0958  TROPONINI <0.03   ------------------------------------------------------------------------------------------------------------------  RADIOLOGY:  Dg Chest 1 View  Result Date: 05/23/2018 CLINICAL DATA:  Hypoxia, respiratory distress. EXAM: CHEST  1 VIEW COMPARISON:  Radiograph of March 27, 2018. FINDINGS: The heart size and mediastinal contours are within normal limits. Atherosclerosis of thoracic aorta is noted. No pneumothorax or pleural effusion is noted. Left lung is clear. New airspace opacities are noted throughout the  right lung consistent with multifocal pneumonia. Old proximal left humeral fracture is noted. IMPRESSION: New airspace opacities are noted throughout right lung most consistent with multifocal pneumonia. Followup PA and lateral chest X-ray is recommended in 3-4 weeks following trial of antibiotic therapy  to ensure resolution and exclude underlying malignancy. Aortic Atherosclerosis (ICD10-I70.0). Electronically Signed   By: Lupita Raider, M.D.   On: 05/23/2018 10:01   US Renal  Result Date: 05/24/2018 CLINICAL DATA:  Acute renal failure EXAM: RENAL / URINARY TRACT ULTRASOUND COMPLETE COMPARISON:  None. FINDINGS: Right Kidney: Length: 8.1 cm. Echogenicity within normal limits. No mass or hydronephrosis visualized. Left Kidney: Length: 9.0 cm. Severe hydronephrosis. 2.7 cm stone in the renal pelvis. Findings similar to prior CT. Bladder: Foley catheter in place, grossly unremarkable. Right pleural effusion noted. There is ascites noted around the liver. IMPRESSION: Severe left hydronephrosis.  2.7 cm renal pelvic stone noted. Right pleural effusion and perihepatic ascites. Electronically Signed   By: Charlett Nose M.D.   On: 05/24/2018 10:32     ASSESSMENT AND PLAN:   77 year old female patient with history of chronic kidney disease stage III, constant disease, multiple sclerosis, osteoporosis currently in the ICU for respiratory distress and pneumonia  Acute hypoxic respiratory failure-  Weaned out of BiPAP Oxygen via nasal cannula Broad-spectrum antibiotics  -Multifocal pneumonia with sepsis Continue IV vancomycin and cefepime antibiotics  -Hypotension secondary to septic shock IV Neo-Synephrine drip to support blood pressure  -Oliguria CKD stage III Monitor renal output  ongoing discussion with family regarding renal replacement therapy by nephrology   All the records are reviewed and case discussed with Care Management/Social Worker. Management plans discussed with the patient, family and they are in agreement.  CODE STATUS: DNR  DVT Prophylaxis: SCDs  TOTAL TIME TAKING CARE OF THIS PATIENT: 34 minutes.   POSSIBLE D/C IN 2 to 3 DAYS, DEPENDING ON CLINICAL CONDITION.  Ihor Austin M.D on 05/24/2018 at 12:10 PM  Between 7am to 6pm - Pager - [phone]  After 6pm go to  [email or web address] - password [email or web address]  [facility] [place] [facility]  Office  [phone]  CC: Primary care physician; Physicians, [facility]  Note: This dictation was prepared with Dragon dictation along with smaller phrase technology. Any transcriptional errors that result from this process are unintentional.

## 2018-05-24 NOTE — Progress Notes (Signed)
[facility]  ROUNDING NOTE   Subjective:  Renal function remains quite diminished. Creatinine currently 4.0 with an EGFR of 10. Urine output was only 190 cc over the preceding 24 hours. Patient currently off of BiPAP. Daughter at the bedside as well. We talked about the potential for renal replacement therapy however at the moment patient and family have opted against this but would like further time to consider the possibility.   Objective:  Vital signs in last 24 hours:  Temp:  [97.4 F (36.3 C)-100.2 F (37.9 C)] 98.3 F (36.8 C) (07/30 0750) Pulse Rate:  [87-124] 92 (07/30 0800) Resp:  [12-28] 15 (07/30 0800) BP: (74-158)/(43-137) 101/53 (07/30 0800) SpO2:  [65 %-100 %] 100 % (07/30 0800) FiO2 (%):  [40 %] 40 % (07/29 1930) Weight:  [36.3 kg (80 lb)-47.3 kg (104 lb 4.4 oz)] 47.3 kg (104 lb 4.4 oz) (07/29 1503)  Weight change:  Filed Weights   05/23/18 0928 05/23/18 1503  Weight: 36.3 kg (80 lb) 47.3 kg (104 lb 4.4 oz)    Intake/Output: I/O last 3 completed shifts: In: 2428.3 [I.V.:2328.3; IV Piggyback:100] Out: 190 [Urine:190]   Intake/Output this shift:  Total I/O In: 340 [I.V.:340] Out: -   Physical Exam: General: No acute distress, laying in bed  Head: Normocephalic, atraumatic. Moist oral mucosal membranes  Eyes: Anicteric  Neck: Supple, trachea midline  Lungs:  Scattered rhonchi, normal effort  Heart: S1S2 no rubs  Abdomen:  Soft, nontender, bowel sounds present  Extremities: no peripheral edema.  Neurologic: Awake, alert, following commands  Skin: No lesions       Basic Metabolic Panel: Recent Labs  Lab 05/23/18 0958 05/23/18 2336 05/24/18 0436  NA 133*  --  137  K 3.5  --  3.5  CL 101  --  110  CO2 15*  --  22  GLUCOSE 98  --  164*  BUN 59*  --  59*  CREATININE 4.89* 4.28* 4.00*  CALCIUM 11.0*  --  8.2*  MG  --  1.5*  --     Liver Function Tests: Recent Labs  Lab 05/23/18 0958  AST 21  ALT 11  ALKPHOS 135*   BILITOT 0.6  PROT 7.2  ALBUMIN 3.6   No results for input(s): LIPASE, AMYLASE in the last 168 hours. No results for input(s): AMMONIA in the last 168 hours.  CBC: Recent Labs  Lab 05/23/18 0958  WBC 8.4  NEUTROABS 7.7*  HGB 17.5*  HCT 52.5*  MCV 91.7  PLT 350    Cardiac Enzymes: Recent Labs  Lab 05/23/18 0958  TROPONINI <0.03    BNP: Invalid input(s): POCBNP  CBG: Recent Labs  Lab 05/23/18 7035  KKXFGH 82    Microbiology: Results for orders placed or performed during the hospital encounter of 05/23/18  MRSA PCR Screening     Status: None   Collection Time: 05/23/18  9:25 AM  Result Value Ref Range Status   MRSA by PCR NEGATIVE NEGATIVE Final    Comment:        The GeneXpert MRSA Assay (FDA approved for NASAL specimens only), is one component of a comprehensive MRSA colonization surveillance program. It is not intended to diagnose MRSA infection nor to guide or monitor treatment for MRSA infections. Performed at [facility], [address] [place], [place] [address]   Blood culture (routine x 2)     Status: None (Preliminary result)   Collection Time: 05/23/18  9:58 AM  Result Value Ref  Range Status   Specimen Description BLOOD RIGHT ARM  Final   Special Requests   Final    BOTTLES DRAWN AEROBIC AND ANAEROBIC Blood Culture adequate volume   Culture   Final    NO GROWTH < 24 HOURS Performed at [facility], [address] [place], [place] [address]    Report Status PENDING  Incomplete  Blood culture (routine x 2)     Status: None (Preliminary result)   Collection Time: 05/23/18  2:01 PM  Result Value Ref Range Status   Specimen Description BLOOD RIGHT HAND  Final   Special Requests   Final    BOTTLES DRAWN AEROBIC AND ANAEROBIC Blood Culture results may not be optimal due to an inadequate volume of blood received in culture bottles   Culture   Final    NO GROWTH < 24 HOURS Performed at [facility], [address] [place], [place] [address]    Report Status PENDING  Incomplete    Coagulation Studies: Recent Labs    05/23/18 2336  LABPROT 17.0*  INR 1.40    Urinalysis: No results for input(s): COLORURINE, LABSPEC, PHURINE, GLUCOSEU, HGBUR, BILIRUBINUR, KETONESUR, PROTEINUR, UROBILINOGEN, NITRITE, LEUKOCYTESUR in the last 72 hours.  Invalid input(s): APPERANCEUR    Imaging: Dg Chest 1 View  Result Date: 05/23/2018 CLINICAL DATA:  Hypoxia, respiratory distress. EXAM: CHEST  1 VIEW COMPARISON:  Radiograph of March 27, 2018. FINDINGS: The heart size and mediastinal contours are within normal limits. Atherosclerosis of thoracic aorta is noted. No pneumothorax or pleural effusion is noted. Left lung is clear. New airspace opacities are noted throughout the right lung consistent with multifocal pneumonia. Old proximal left humeral fracture is noted. IMPRESSION: New airspace opacities are noted throughout right lung most consistent with multifocal pneumonia. Followup PA and lateral chest X-ray is recommended in 3-4 weeks following trial of antibiotic therapy to ensure resolution and exclude underlying malignancy. Aortic Atherosclerosis (ICD10-I70.0). Electronically Signed   By: Marijo Conception, M.D.   On: 05/23/2018 10:01     Medications:   . phenylephrine (NEO-SYNEPHRINE) Adult infusion 30 mcg/min (05/24/18 0800)  .  sodium bicarbonate  infusion 1000 mL 125 mL/hr at 05/24/18 0800  . [START ON 05/28/2018] vancomycin     . albuterol  2.5 mg Nebulization Q6H  . amphetamine-dextroamphetamine  30 mg Oral Daily  . ceFEPime (MAXIPIME) IV  500 mg Intravenous Q24H  . chlorhexidine  15 mL Mouth Rinse BID  . cholecalciferol  800 Units Oral Daily  . escitalopram  20 mg Oral Daily  . feeding supplement (ENSURE ENLIVE)  237 mL Oral TID BM  . ferrous sulfate  325 mg Oral q12n4p  . fesoterodine  4 mg Oral Daily  . heparin  5,000 Units Subcutaneous Q8H  . lipase/protease/amylase  72,000 Units Oral TID WC  .  magnesium gluconate  500 mg Oral BID  . mouth rinse  15 mL Mouth Rinse q12n4p  . multivitamin with minerals  1 tablet Oral Daily  . pantoprazole  40 mg Oral BID  . sucralfate  1 g Oral TID WC & HS  . ascorbic acid  250 mg Oral BID   acetaminophen **OR** acetaminophen, albuterol, bisacodyl, guaiFENesin, ketorolac, ondansetron **OR** ondansetron (ZOFRAN) IV, senna-docusate, traMADol  Assessment/ Plan:  77 y.o. female with a PMHx of chronic kidney disease stage III, Crohn's disease, multiple sclerosis, osteoporosis, overactive bladder, who was admitted to [facility] on 05/23/2018 for evaluation of respiratory distress and acute respiratory failure due to multifocal  pneumonia.   1.  Acute renal failure secondary to hypotension, pneumonia 2.  CKD stage III baseline EGFR 38. 3.  Acute respiratory failure 4.  Metabolic acidosis.  5.  Multifocal pneumonia with sepsis. 6.  Hypotension.   Plan:  Patient continues to have severe acute renal failure.  Creatinine currently 4.0 with an EGFR of 10.  Patient only made 190 cc of urine over the preceding 24 hours.  We talked about the potential for short-term renal replacement therapy.  For now the vision and her family have decided against this but would like further time to consider this possibility.  I advised patient and her daughter to notify nursing if they change her mind regarding renal placement therapy so that we can make the appropriate arrangements.  Otherwise continue supportive care with IV fluids as creatinine has come down a bit with hydration.  Overall prognosis remains guarded.    LOS: 1 Jasie Meleski 7/30/20198:37 AM

## 2018-05-24 NOTE — Clinical Social Work Note (Signed)
Clinical Social Work Assessment  Patient Details  Name: Adrienne Patel MRN: [id number] Date of Birth: June 11, 1941  Date of referral:  05/24/18               Reason for consult:  End of Life/Hospice                Permission sought to share information with:    Permission granted to share information::     Name::        Agency::     Relationship::     Contact Information:     Housing/Transportation Living arrangements for the past 2 months:  Single Family Home Source of Information:  Adult Children Patient Interpreter Needed:  None Criminal Activity/Legal Involvement Pertinent to Current Situation/Hospitalization:  No - Comment as needed Significant Relationships:  Adult Children Lives with:  Self Do you feel safe going back to the place where you live?    Need for family participation in patient care:  Yes (Comment)  Care giving concerns:  Patient resides at home.   Social Worker assessment / plan:  CSW received consult from Palliative Care for hospice home placement. CSW spoke with patient's daughter Adrienne Patel to extend choices of hospice homes and she prefers [facility] [address]. CSW has made the referral to [place] with [name].  Employment status:  Retired Health and safety inspector:  Medicare PT Recommendations:    Information / Referral to community resources:     Patient/Family's Response to care:  Patient's daughter expressed appreciation for CSW assistance.  Patient/Family's Understanding of and Emotional Response to Diagnosis, Current Treatment, and Prognosis:  CSW provided emotional support to daughter who was processing end of life decisions with her mother. Patient was not disturbed as she was visiting with several family at friends at time of CSW visit.  Emotional Assessment Appearance:  Appears stated age Attitude/Demeanor/Rapport:  (visiting with family and friends) Affect (typically observed):  Calm Orientation:  Oriented to Self, Oriented to  Place Alcohol / Substance use:  Not Applicable Psych involvement (Current and /or in the community):  No (Comment)  Discharge Needs  Concerns to be addressed:  Care Coordination Readmission within the last 30 days:  No Current discharge risk:  None Barriers to Discharge:  No Barriers Identified   York Spaniel, LCSW 05/24/2018, 4:19 PM

## 2018-05-24 NOTE — Progress Notes (Signed)
Pt BP decreasing throughout night 1.5 L of fluid given with some increase on BP. Later on Bp began to decrease again, pt started on Neo with a increase in BP. Pt having no urine output, bladder scanned, showing 200 mL of urine. Foley placed per order.

## 2018-05-24 NOTE — Progress Notes (Signed)
Patients family member made RN aware that they were ready to start comfort care measures. Neo drip and IVF's stopped. All monitoring devices except cardiac monitor removed. Patient is alert and talking to visitors and family members; PRN pain medication given per patient request.

## 2018-05-24 NOTE — Progress Notes (Signed)
Nutrition Brief Note  77 year old female with PMHx of CKD, Crohn's with ileostomy, multiple sclerosis, OP admitted with respiratory failure related to PNA, renal failure, hypotension, hyponatremia.  Patient identified to be seen for Low Braden score and low BMI. Met with patient at bedside this AM. She reports a poor appetite for a while now. She may have regular yogurt in AM. At dinner she eats bites of meat and vegetables. She does not like Ensure. Prefers clear supplements such as Boost. Patient reports her UBW was 140 lbs. Per chart she was 123.5 lbs on 03/30/2018 and is currently 104.3 lbs. She has lost 19.2 lbs (15.5% body weight) over the past almost 2 months, which is significant for time frame. Patient also noted to have severe depletion of subcutaneous fat and muscle on NFPE.  Nutrition Focused Physical Exam:   Most Recent Value  Orbital Region  Severe depletion  Upper Arm Region  Severe depletion  Thoracic and Lumbar Region  Severe depletion  Buccal Region  Severe depletion  Temple Region  Severe depletion  Clavicle Bone Region  Severe depletion  Clavicle and Acromion Bone Region  Severe depletion  Scapular Bone Region  Severe depletion  Dorsal Hand  Moderate depletion  Patellar Region  Severe depletion  Anterior Thigh Region  Severe depletion  Posterior Calf Region  Severe depletion  Edema (RD Assessment)  Mild  Hair  Reviewed  Eyes  Reviewed  Mouth  Unable to assess  Skin  Reviewed  Nails  Reviewed     Patient meets criteria for severe malnutrition related to chronic illness (Crohn's,CKD) as evidenced by severe fat depletion, severe muscle depletion, 15.5% weight loss over 2 months.  Noted orders have now been placed for patient to transition to comfort care.  No further nutrition interventions warranted at this time. Please consult RD as needed.   Willey Blade, MS, [place], LDN Office: [phone] Pager: [phone] After Hours/Weekend Pager: [phone]

## 2018-05-24 NOTE — Consult Note (Addendum)
Consultation Note Date: 05/24/2018   Patient Name: Adrienne Patel  DOB: 10/21/1941  MRN: [id number]  Age / Sex: 77 y.o., female  PCP: [facility] [place] Referring Physician: Saundra Shelling, MD  Reason for Consultation: Establishing goals of care  HPI/Patient Profile: 77 y.o. female  with past medical history of Crohn's disease, chronicrenal disease, OAB, MS, osteoarthritis, illeostomy, upper GI bleed admitted on 05/23/2018 with respiratory distress, hypotension. Workup reveals HCAP with sepsis. She also has acute on chronic renal failure. Is currently requiring IV neosynephrine for BP support. On bipap last night. Palliative medicine consulted for [place].    Clinical Assessment and Goals of Care:  I have reviewed medical records including EPIC notes, labs and imaging, received report from patient's RN assessed the patient and then met at the bedside along with patient's daughter to discuss diagnosis prognosis, GOC, EOL wishes, disposition and options.  I introduced Palliative Medicine as specialized medical care for people living with serious illness. It focuses on providing relief from the symptoms and stress of a serious illness. The goal is to improve quality of life for both the patient and the family.  We discussed a brief life review of the patient. 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 Her husband lives in the home, but has very severe dementia. Her daughter is her HCPOA. She used to enjoy gardening and is known for growing large gourds.   As far as functional and nutritional status- she has had ongoing decline in the last several months. She was recently discharged from the hospital to [facility] for rehab after hospitalization for GI bleed. It was recommended that she remain there for long term care, however, she wanted to return home. She is wheelchair  bound, unable to walk. Her appetite has decreased. She complains of generalized pain all over. States she has taken oxycodone in the past with relief. No other complaints or worries.    We discussed their current illness and what it means in the larger context of their on-going co-morbidities.  Natural disease trajectory and expectations at EOL were discussed. She has stated to her daughter and pastor that she is tired of being in pain, she is tired of being sick, she is ready to go. She tells me that she felt the end was coming last Saturday. She stated she has no worries.   I attempted to elicit values and goals of care important to the patient. She wants to focus on her comfort and to not be in pain. She has spent much of her life being in pain. Pain control is very important.   The difference between aggressive medical intervention and comfort care was considered in light of the patient's goals of care.   Advanced directives, concepts specific to code status, artifical feeding and hydration, and rehospitalization were considered and discussed.   Hospice and Palliative Care services outpatient were explained and offered. Patient and family requested transition to comfort care and referral for residential hospice.  Questions and concerns were addressed.  Hard Choices booklet left for review. The family was encouraged to call with questions or concerns.   Primary Decision Maker PATIENT and her daughterOlivia Mackie Bullins    SUMMARY OF RECOMMENDATIONS -Start gentle transition to comfort measures -D/C labs, antibiotics, some medications not affecting comfort -Referral for hospice home -Patient and family request to wait to d/c neo until patient has had time to visit with spouse and stepchildren -Oxycodone solution 58m q2hr for moderate pain -Hydromorphone .[id number] for SOB or severe pain or of patient cannot take po medication -Robinul .2mc38m4hr prn secretions -lorazepam 1mg 39mq4hrs prn  anxiety -hydromorphone .5mg q31min 43mfor severe pain or SOB     Code Status/Advance Care Planning:  DNR  Palliative Prophylaxis:   Frequent Pain Assessment  Additional Recommendations (Limitations, Scope, Preferences):  Full Comfort Care and Minimize Medications  Psycho-social/Spiritual:   Desire for further Chaplaincy support:yes  Additional Recommendations: Education on Hospice  Prognosis:    < 2 weeks d/t pnuemonia, sepsis, renal failure, transition to comfort care  Discharge Planning: Hospice facility  Primary Diagnoses: Present on Admission: . Sepsis (HCC)   [place] reviewed the medical record, interviewed the patient and family, and examined the patient. The following aspects are pertinent.  Past Medical History:  Diagnosis Date  . CKD (chronic kidney disease)   . Crohn's disease (HCC)   [place]   . MS (multiple sclerosis) (HCC)   [place]   . Overactive bladder    Social History   Socioeconomic History  . Marital status: Married    Spouse name: Not on file  . Number of children: Not on file  . Years of education: Not on file  . Highest education level: Not on file  Occupational History  . Not on file  Social Needs  . Financial resource strain: Not on file  . Food insecurity:    Worry: Not on file    Inability: Not on file  . Transportation needs:    Medical: Not on file    Non-medical: Not on file  Tobacco Use  . Smoking status: Former Smoker    Last attempt to quit: 03/27/1986    Years since quitting: 32.1  . Smokeless tobacco: Never Used  Substance and Sexual Activity  . Alcohol use: Not Currently  . Drug use: Yes    Types: Marijuana    Comment: "Years ago"  . Sexual activity: Not on file  Lifestyle  . Physical activity:    Days per week: Not on file    Minutes per session: Not on file  . Stress: Not on file  Relationships  . Social connections:    Talks on phone: Not on file    Gets together: Not on file    Attends  religious service: Not on file    Active member of club or organization: Not on file    Attends meetings of clubs or organizations: Not on file    Relationship status: Not on file  Other Topics Concern  . Not on file  Social History Narrative  . Not on file   Family History  Problem Relation Age of Onset  . Heart disease Mother   . Hypertension Mother   . Heart disease Father   . Hypertension Father    Scheduled Meds: . albuterol  2.5 mg Nebulization Q6H  . amphetamine-dextroamphetamine  30 mg Oral Daily  . chlorhexidine  15 mL Mouth Rinse BID  . escitalopram  20 mg Oral Daily  . feeding supplement  1 Container Oral TID BM  . hydrocortisone sod succinate (SOLU-CORTEF) inj  100 mg Intravenous Q8H  . lipase/protease/amylase  72,000 Units Oral TID WC  . magnesium gluconate  500 mg Oral BID  . mouth rinse  15 mL Mouth Rinse q12n4p  . pantoprazole  40 mg Oral BID  . sucralfate  1 g Oral TID WC & HS   Continuous Infusions: . phenylephrine (NEO-SYNEPHRINE) Adult infusion    .  sodium bicarbonate  infusion 1000 mL 125 mL/hr at 05/24/18 1001   PRN Meds:.acetaminophen **OR** acetaminophen, albuterol, bisacodyl, guaiFENesin, ondansetron **OR** ondansetron (ZOFRAN) IV, oxyCODONE, traMADol Medications Prior to Admission:  Prior to Admission medications   Medication Sig Start Date End Date Taking? Authorizing Provider  acetaminophen (TYLENOL) 325 MG tablet Take 2 tablets (650 mg total) by mouth every 6 (six) hours as needed for mild pain (or Fever >/= 101). Patient taking differently: Take 650 mg by mouth every 6 (six) hours as needed for mild pain or fever.  03/30/18  Yes Gouru, Aruna, MD  amphetamine-dextroamphetamine (ADDERALL XR) 30 MG 24 hr capsule Take 30 mg by mouth daily.   Yes [provider]  cholecalciferol (VITAMIN D-400) 400 units TABS tablet Take 800 Units by mouth daily.   Yes [provider]  cyanocobalamin (,VITAMIN B-12,) 1000 MCG/ML injection Inject 1,000  mcg into the muscle every 30 (thirty) days. On the 18th of each month   Yes [provider]  escitalopram (LEXAPRO) 20 MG tablet Take 20 mg by mouth daily.   Yes [provider]  feeding supplement, ENSURE ENLIVE, (ENSURE ENLIVE) LIQD Take 237 mLs by mouth 3 (three) times daily between meals. 03/30/18  Yes Gouru, Illene Silver, MD  ferrous sulfate 325 (65 FE) MG EC tablet Take 1 tablet (325 mg total) by mouth 2 times daily at 12 noon and 4 pm. 03/30/18  Yes Gouru, Aruna, MD  fesoterodine (TOVIAZ) 4 MG TB24 tablet Take 4 mg by mouth daily.   Yes [provider]  lipase/protease/amylase (CREON) 36000 UNITS CPEP capsule Take 2 capsules (72,000 Units total) by mouth 3 (three) times daily with meals. 03/30/18  Yes Gouru, Illene Silver, MD  magnesium gluconate (MAGONATE) 500 MG tablet Take 500 mg by mouth 2 (two) times daily.   Yes [provider]  Multiple Vitamin (MULTIVITAMIN WITH MINERALS) TABS tablet Take 1 tablet by mouth daily. 03/31/18  Yes Gouru, Illene Silver, MD  pantoprazole (PROTONIX) 40 MG tablet Take 1 tablet (40 mg total) by mouth 2 (two) times daily. 03/30/18  Yes Gouru, Aruna, MD  sucralfate (CARAFATE) 1 g tablet Take 1 tablet (1 g total) by mouth 4 (four) times daily -  with meals and at bedtime. 03/30/18  Yes Gouru, Illene Silver, MD  traMADol (ULTRAM) 50 MG tablet Take 1-2 tablets (50-100 mg total) by mouth every 6 (six) hours as needed for moderate pain or severe pain. 03/30/18  Yes Gouru, Illene Silver, MD  vitamin C (VITAMIN C) 250 MG tablet Take 1 tablet (250 mg total) by mouth 2 (two) times daily. 03/30/18  Yes Gouru, Illene Silver, MD   No Known Allergies Review of Systems  Constitutional: Positive for appetite change and unexpected weight change.  Respiratory: Positive for cough. Negative for shortness of breath.   Genitourinary: Positive for decreased urine volume.  Psychiatric/Behavioral: Negative for sleep disturbance.    Physical Exam  Constitutional: She is oriented to person, place, and time.    Frail, cachexic  Cardiovascular: Normal rate.  Pulmonary/Chest: Effort normal.  Abdominal: Soft.  Neurological: She is alert and oriented to person, place, and time.  Skin: There is pallor.  Psychiatric: She has a normal mood and affect.  Nursing note and vitals reviewed.   Vital Signs: BP (!) 91/48   Pulse (!) 114   Temp 98.3 F (36.8 C)   Resp (!) 22   Ht _0  (1.676 m)   Wt 47.3 kg (104 lb 4.4 oz)   SpO2 100%   BMI 16.83 kg/m  Pain Scale: 0-10   Pain Score: 7    SpO2: SpO2: 100 % O2 Device:SpO2: 100 % O2 Flow Rate: .O2 Flow Rate (L/min): 4 L/min  IO: Intake/output summary:   Intake/Output Summary (Last 24 hours) at 05/24/2018 1210 Last data filed at 05/24/2018 1001 Gross per 24 hour  Intake 3011.16 ml  Output 190 ml  Net 2821.16 ml    LBM: Last BM Date: 05/23/18 Baseline Weight: Weight: 36.3 kg (80 lb) Most recent weight: Weight: 47.3 kg (104 lb 4.4 oz)     Palliative Assessment/Data: PPS: 20%     Thank you for this consult. Palliative medicine will continue to follow and assist as needed.   Time In: 1100 Time Out: 1230 Time Total: 90 minutes Greater than 50%  of this time was spent counseling and coordinating care related to the above assessment and plan.  Signed by: Mariana Kaufman, AGNP-C Palliative Medicine    Please contact Palliative Medicine Team phone at [phone] for questions and concerns.  For individual provider: See Shea Evans

## 2018-05-25 MED ORDER — LORAZEPAM 1 MG PO TABS
1.0000 mg | ORAL_TABLET | Freq: Four times a day (QID) | ORAL | Status: DC
  Administered 2018-05-25 (×2): 1 mg via SUBLINGUAL
  Filled 2018-05-25 (×3): qty 1

## 2018-05-25 MED ORDER — TRAZODONE HCL 50 MG PO TABS: 50.0000 mg | ORAL_TABLET | Freq: Every day | ORAL | Status: DC

## 2018-05-25 MED ORDER — OXYCODONE HCL 5 MG/5ML PO SOLN
10.0000 mg | ORAL | Status: DC | PRN
  Filled 2018-05-25: qty 10

## 2018-05-25 MED ORDER — HYDROMORPHONE HCL 1 MG/ML IJ SOLN
0.5000 mg | INTRAMUSCULAR | Status: DC | PRN
  Administered 2018-05-25 – 2018-05-26 (×4): 0.5 mg via INTRAVENOUS
  Filled 2018-05-25 (×4): qty 1

## 2018-05-25 MED ORDER — LORAZEPAM 2 MG/ML IJ SOLN
1.0000 mg | INTRAMUSCULAR | Status: DC | PRN
  Administered 2018-05-26: 1 mg via INTRAVENOUS
  Filled 2018-05-25: qty 1

## 2018-05-25 MED ORDER — OXYCODONE HCL 5 MG/5ML PO SOLN
10.0000 mg | ORAL | Status: DC
  Administered 2018-05-25 (×3): 10 mg via ORAL
  Filled 2018-05-25 (×3): qty 10

## 2018-05-25 MED ORDER — HYDROMORPHONE HCL 1 MG/ML IJ SOLN: 0.5000 mg | INTRAMUSCULAR | Status: DC | PRN

## 2018-05-25 NOTE — Progress Notes (Signed)
Follow up - Critical Care Medicine Note  Patient Details:    Adrienne Patel is an 77 y.o. female  with a past medical history remarkable for chronic renal disease, Crohn's, multiple sclerosis, osteoporosis, ileostomy, severe wasting and cachexia, upper GI bleed, recently discharged from the hospital to rehabilitation, was found to be hypoxemic with oxygen saturations in the 60s, Chest x-ray showing multi lobar pneumonia on the right along with renal failure with a creatinine of 4.89, BUN 59. Patient was transferred to the intensive care unit for BiPAP.    Lines, Airways, Drains: Ileostomy Standard (end) (Active)  Ostomy Pouch 2 piece 05/23/2018  7:27 PM  Stoma Assessment Red 05/23/2018  7:27 PM  Peristomal Assessment Intact 05/23/2018  7:27 PM     Ileostomy Standard (end) RLQ (Active)     Urethral Catheter Trilby Leaver, RN Double-lumen;Straight-tip 14 Fr. (Active)  Indication for Insertion or Continuance of Catheter Unstable critical patients (first 24-48 hours) 05/24/2018  8:00 AM  Output (mL) 40 mL 05/24/2018  6:00 AM    Anti-infectives:  Anti-infectives (From admission, onward)   Start     Dose/Rate Route Frequency Ordered Stop   05/28/18 1000  vancomycin (VANCOCIN) 500 mg in sodium chloride 0.9 % 100 mL IVPB  Status:  Discontinued     500 mg 100 mL/hr over 60 Minutes Intravenous  Once 05/23/18 1058 05/24/18 1201   05/24/18 1000  ceFEPIme (MAXIPIME) 500 mg in dextrose 5 % 50 mL IVPB  Status:  Discontinued     500 mg 100 mL/hr over 30 Minutes Intravenous Every 24 hours 05/23/18 1058 05/24/18 1201   05/23/18 1045  vancomycin (VANCOCIN) IVPB 750 mg/150 ml premix  Status:  Discontinued     750 mg 150 mL/hr over 60 Minutes Intravenous  Once 05/23/18 1034 05/23/18 1058   05/23/18 1045  ceFEPIme (MAXIPIME) 2 g in sodium chloride 0.9 % 100 mL IVPB  Status:  Discontinued     2 g 200 mL/hr over 30 Minutes Intravenous  Once 05/23/18 1044 05/23/18 1045   05/23/18 1045  vancomycin (VANCOCIN) IVPB  1000 mg/200 mL premix     1,000 mg 200 mL/hr over 60 Minutes Intravenous  Once 05/23/18 1044 05/23/18 1201   05/23/18 1030  ceFEPIme (MAXIPIME) 2 g in sodium chloride 0.9 % 100 mL IVPB     2 g 200 mL/hr over 30 Minutes Intravenous  Once 05/23/18 1015 05/23/18 1053   05/23/18 1030  vancomycin (VANCOCIN) IVPB 1000 mg/200 mL premix  Status:  Discontinued     1,000 mg 200 mL/hr over 60 Minutes Intravenous  Once 05/23/18 1015 05/23/18 1034      Microbiology: Results for orders placed or performed during the hospital encounter of 05/23/18  MRSA PCR Screening     Status: None   Collection Time: 05/23/18  9:25 AM  Result Value Ref Range Status   MRSA by PCR NEGATIVE NEGATIVE Final    Comment:        The GeneXpert MRSA Assay (FDA approved for NASAL specimens only), is one component of a comprehensive MRSA colonization surveillance program. It is not intended to diagnose MRSA infection nor to guide or monitor treatment for MRSA infections. Performed at [facility], [address] [place], [place] [address]   Blood culture (routine x 2)     Status: None (Preliminary result)   Collection Time: 05/23/18  9:58 AM  Result Value Ref Range Status   Specimen Description BLOOD RIGHT ARM  Final   Special Requests  Final    BOTTLES DRAWN AEROBIC AND ANAEROBIC Blood Culture adequate volume   Culture   Final    NO GROWTH 2 DAYS Performed at [facility], [address] [place], [place] [address]    Report Status PENDING  Incomplete  Blood culture (routine x 2)     Status: None (Preliminary result)   Collection Time: 05/23/18  2:01 PM  Result Value Ref Range Status   Specimen Description BLOOD RIGHT HAND  Final   Special Requests   Final    BOTTLES DRAWN AEROBIC AND ANAEROBIC Blood Culture results may not be optimal due to an inadequate volume of blood received in culture bottles   Culture   Final    NO GROWTH 2 DAYS Performed at [facility], [address], [place], [place] [address]    Report Status PENDING  Incomplete    Studies: Dg Chest 1 View  Result Date: 05/23/2018 CLINICAL DATA:  Hypoxia, respiratory distress. EXAM: CHEST  1 VIEW COMPARISON:  Radiograph of March 27, 2018. FINDINGS: The heart size and mediastinal contours are within normal limits. Atherosclerosis of thoracic aorta is noted. No pneumothorax or pleural effusion is noted. Left lung is clear. New airspace opacities are noted throughout the right lung consistent with multifocal pneumonia. Old proximal left humeral fracture is noted. IMPRESSION: New airspace opacities are noted throughout right lung most consistent with multifocal pneumonia. Followup PA and lateral chest X-ray is recommended in 3-4 weeks following trial of antibiotic therapy to ensure resolution and exclude underlying malignancy. Aortic Atherosclerosis (ICD10-I70.0). Electronically Signed   By: Marijo Conception, M.D.   On: 05/23/2018 10:01   US Renal  Result Date: 05/24/2018 CLINICAL DATA:  Acute renal failure EXAM: RENAL / URINARY TRACT ULTRASOUND COMPLETE COMPARISON:  None. FINDINGS: Right Kidney: Length: 8.1 cm. Echogenicity within normal limits. No mass or hydronephrosis visualized. Left Kidney: Length: 9.0 cm. Severe hydronephrosis. 2.7 cm stone in the renal pelvis. Findings similar to prior CT. Bladder: Foley catheter in place, grossly unremarkable. Right pleural effusion noted. There is ascites noted around the liver. IMPRESSION: Severe left hydronephrosis.  2.7 cm renal pelvic stone noted. Right pleural effusion and perihepatic ascites. Electronically Signed   By: Rolm Baptise M.D.   On: 05/24/2018 10:32    Consults: Treatment Team:  Anthonette Legato, MD   Subjective:    Overnight Issues:Patient met with palliative care and after discussion wishes to go comfort care. Pressors have been stopped. Patient is receiving morphine and Ativan as needed. Is complaining of some pain this morning we'll dose. Pending  floor transfer  Objective:  Vital signs for last 24 hours: Temp:  [98.3 F (36.8 C)-99 F (37.2 C)] 99 F (37.2 C) (07/30 1946) Pulse Rate:  [44-140] 97 (07/30 1400) Resp:  [10-22] 13 (07/31 0700) BP: (61-111)/(46-62) 111/62 (07/30 1400) SpO2:  [79 %-100 %] 97 % (07/30 1400)  Hemodynamic parameters for last 24 hours:    Intake/Output from previous day: 07/30 0701 - 07/31 0700 In: 1532.6 [P.O.:120; I.V.:1412.6] Out: 795 [Urine:270; Stool:525]  Intake/Output this shift: No intake/output data recorded.  Vent settings for last 24 hours:    Physical Exam:   Cachectic elderly-appearing female, awake, alert and communicating Vital signs:       Please see the above listed vital signs HEENT:           Extensive muscle wasting noted, shoulders, supraclavicular fossa, temporal wasting, pectoral region, no oral lesions appreciated, limited exam secondary to noninvasive ventilation, minimal  accessory muscle utilization noted Cardiovascular:           Tachycardia,regular, sinus mechanism noted on monitor Pulmonary:      Relatively clear to auscultation Abdominal:      Ileostomy noted Extremities:     No clubbing,edema,peripheral cyanosis is noted over both distal feet with purplish hue Neurologic:      Patient moves extremities    Assessment/Plan:   Patient has been transition to comfort care, pain control with oxycodone, Dilaudid, lorazepam and morphine as needed. Pending floor transfer   Samhitha Rosen 05/25/2018  *Care during the described time interval was provided by me and/or other providers on the critical care team.  I have reviewed this patient's available data, including medical history, events of note, physical examination and test results as part of my evaluation. Patient ID: Adrienne Patel, female   DOB: 1940-11-09, 77 y.o.   MRN: [id number]

## 2018-05-25 NOTE — Progress Notes (Signed)
Visit made to new hospice home referral. Patient is a 77 year old woman with a past medical history of Crohn's disease, CKD, Multiple sclerosis, osteoporosis and ileostomy admitted to [facility] on 7/29 for acute respiratory distress, found to be septic, required BIPAP and vasopressors for blood pressure support. She has continued to decline despite medical interventions. Palliative Medicine was consulted for goals of care. At this time family has chosen to focus on comfort with transfer to the hospice home. Writer met in the room with patient's daughter Adrienne Patel to initiate education regarding hospice services, philosophy and team approach to care with good understanding voiced. Questions answered, consents signed. Adrienne Patel and hospital care team are aware there are currently no beds available for transfer. Plan is for transfer to the hospice home tomorrow. Hospital care team updated. Patient appeared to be resting comfortably during visit, she is currently requiring oral oxycodone and lorazepam for symptom management. Updated notes faxed to referral. Will continue to follow through final disposition. Flo Shanks RN, BSN, [facility] [place], hospital liaison [phone]

## 2018-05-25 NOTE — Progress Notes (Signed)
Pt was made comfort care yesterday, and rested overnight with daughter and family at bedside.

## 2018-05-25 NOTE — Progress Notes (Signed)
Daily Progress Note   Patient Name: Adrienne Patel       Date: 05/25/2018 DOB: Jun 07, 1941  Age: 77 y.o. MRN#: [id number] Attending Physician: Ihor Austin, MD Primary Care Physician: [facility] Admit Date: 05/23/2018  Reason for Consultation/Follow-up: Establishing goals of care and Terminal Care  Subjective: Patient in bed. Grimacing a little. Daughter states she did not sleep much last night. Also that she does not ask for medications or state needs.  We discussed scheduling lorazepam for comfort. Also will increase oxycodone dose. Patient and family in agreement.  No beds available at hospice facility today.   ROS  Length of Stay: 2  Current Medications: Scheduled Meds:  . chlorhexidine  15 mL Mouth Rinse BID  . LORazepam  1 mg Sublingual Q6H  . mouth rinse  15 mL Mouth Rinse q12n4p  . oxyCODONE  10 mg Oral Q4H    Continuous Infusions:   PRN Meds: albuterol, glycopyrrolate, guaiFENesin, HYDROmorphone (DILAUDID) injection, LORazepam, ondansetron **OR** ondansetron (ZOFRAN) IV, oxyCODONE  Physical Exam          Vital Signs: BP 111/62   Pulse 97   Temp 99 F (37.2 C) (Oral)   Resp 13   Ht 5\' 6"  (1.676 m)   Wt 47.3 kg (104 lb 4.4 oz)   SpO2 97%   BMI 16.83 kg/m  SpO2: SpO2: 97 % O2 Device: O2 Device: Nasal Cannula O2 Flow Rate: O2 Flow Rate (L/min): 2 L/min  Intake/output summary:   Intake/Output Summary (Last 24 hours) at 05/25/2018 1144 Last data filed at 05/25/2018 0500 Gross per 24 hour  Intake 677.5 ml  Output 795 ml  Net -117.5 ml   LBM: Last BM Date: 05/25/18 Baseline Weight: Weight: 36.3 kg (80 lb) Most recent weight: Weight: 47.3 kg (104 lb 4.4 oz)       Palliative Assessment/Data: PPS: 20%     Patient Active Problem List   Diagnosis Date Noted  . Pressure injury of skin 05/24/2018  . Sepsis (HCC) 05/23/2018  . GI bleed 03/29/2018  . Protein-calorie malnutrition, severe 03/28/2018  . GIB (gastrointestinal bleeding) 03/27/2018    Palliative Care Assessment & Plan   Patient Profile: 77 y.o. female  with past medical history of Crohn's disease, chronicrenal disease, OAB, MS, osteoarthritis, illeostomy, upper GI bleed admitted on 05/23/2018 with respiratory distress,  hypotension. Workup reveals HCAP with sepsis. She also has acute on chronic renal failure. Is currently requiring IV neosynephrine for BP support. On bipap last night. Palliative medicine consulted for GOC.     Assessment/Recommendations/Plan   Oxycodone solution 10mg  po q4hr pain  Lorazepam 1mg  sublingual q6hr for anxiety  Please use hydromorphone .5mg  IV for SOB or pain  Avoid morphine due to GFR <30 (results in metabolite build up and discomfort in patients)  Goals of Care and Additional Recommendations:  Limitations on Scope of Treatment: Full Comfort Care  Code Status:  DNR  Prognosis:   < 2 weeks d/t pnuemonia, sepsis, transition to full comfort care  Discharge Planning:  Hospice facility pending bed availability  Care plan was discussed with patient, family and patient's RN  Thank you for allowing the Palliative Medicine Team to assist in the care of this patient.   Time In: 1100 Time Out: 1135 Total Time 35 mins Prolonged Time Billed NO      Greater than 50%  of this time was spent counseling and coordinating care related to the above assessment and plan.  Ocie Bob, AGNP-C Palliative Medicine   Please contact Palliative Medicine Team phone at [phone] for questions and concerns.

## 2018-05-25 NOTE — Progress Notes (Signed)
SOUND [facility] - [place] at [facility]   PATIENT NAME: Adrienne Patel    MR#:  [id number]  DATE OF BIRTH:  1940/11/21  SUBJECTIVE:  CHIEF COMPLAINT:   Chief Complaint  Patient presents with  . Respiratory Distress  Patient seen today On oxygen via nasal canula Under comfort measures  REVIEW OF SYSTEMS:    ROS  CONSTITUTIONAL: No documented fever. Has fatigue, weakness. No weight gain, no weight loss.  EYES: No blurry or double vision.  ENT: No tinnitus. No postnasal drip. No redness of the oropharynx.  RESPIRATORY: No cough, no wheeze, no hemoptysis. No dyspnea.  CARDIOVASCULAR: No chest pain. No orthopnea. No palpitations. No syncope.  GASTROINTESTINAL: No nausea, no vomiting or diarrhea. No abdominal pain. No melena or hematochezia.  GENITOURINARY: No dysuria or hematuria.  ENDOCRINE: No polyuria or nocturia. No heat or cold intolerance.  HEMATOLOGY: No anemia. No bruising. No bleeding.  INTEGUMENTARY: No rashes. No lesions.  MUSCULOSKELETAL: No arthritis. No swelling. No gout.  NEUROLOGIC: No numbness, tingling, or ataxia. No seizure-type activity.  PSYCHIATRIC: No anxiety. No insomnia. No ADD.   DRUG ALLERGIES:  No Known Allergies  VITALS:  Blood pressure 111/62, pulse 97, temperature 99 F (37.2 C), temperature source Oral, resp. rate 12, height 5\' 6"  (1.676 m), weight 47.3 kg (104 lb 4.4 oz), SpO2 97 %.  PHYSICAL EXAMINATION:   Physical Exam  GENERAL:  77 y.o.-year-old patient lying in the bed with no acute distress.  EYES: Pupils equal, round, reactive to light and accommodation. No scleral icterus. Extraocular muscles intact.  HEENT: Head atraumatic, normocephalic. Oropharynx and nasopharynx clear.  NECK:  Supple, no jugular venous distention. No thyroid enlargement, no tenderness.  LUNGS: Decreased breath sounds bilaterally, bilateral rales heard. No use of accessory muscles of respiration.  CARDIOVASCULAR: S1, S2 normal. No murmurs, rubs, or  gallops.  ABDOMEN: Soft, nontender, nondistended. Bowel sounds present. No organomegaly or mass.  EXTREMITIES: No cyanosis, clubbing or edema b/l.    NEUROLOGIC: Cranial nerves II through XII are intact. No focal Motor or sensory deficits b/l.   PSYCHIATRIC: The patient is alert and oriented x 3.  SKIN: No obvious rash, lesion, or ulcer.   LABORATORY PANEL:   CBC Recent Labs  Lab 05/24/18 0436  WBC 10.9  HGB 10.8*  HCT 31.9*  PLT 208   ------------------------------------------------------------------------------------------------------------------ Chemistries  Recent Labs  Lab 05/23/18 0958 05/23/18 2336 05/24/18 0436  NA 133*  --  137  K 3.5  --  3.5  CL 101  --  110  CO2 15*  --  22  GLUCOSE 98  --  164*  BUN 59*  --  59*  CREATININE 4.89* 4.28* 4.00*  CALCIUM 11.0*  --  8.2*  MG  --  1.5*  --   AST 21  --   --   ALT 11  --   --   ALKPHOS 135*  --   --   BILITOT 0.6  --   --    ------------------------------------------------------------------------------------------------------------------  Cardiac Enzymes Recent Labs  Lab 05/23/18 0958  TROPONINI <0.03   ------------------------------------------------------------------------------------------------------------------  RADIOLOGY:  US Renal  Result Date: 05/24/2018 CLINICAL DATA:  Acute renal failure EXAM: RENAL / URINARY TRACT ULTRASOUND COMPLETE COMPARISON:  None. FINDINGS: Right Kidney: Length: 8.1 cm. Echogenicity within normal limits. No mass or hydronephrosis visualized. Left Kidney: Length: 9.0 cm. Severe hydronephrosis. 2.7 cm stone in the renal pelvis. Findings similar to prior CT. Bladder: Foley catheter in place, grossly unremarkable. Right pleural effusion  noted. There is ascites noted around the liver. IMPRESSION: Severe left hydronephrosis.  2.7 cm renal pelvic stone noted. Right pleural effusion and perihepatic ascites. Electronically Signed   By: Charlett Nose M.D.   On: 05/24/2018 10:32      ASSESSMENT AND PLAN:   77 year old female patient with history of chronic kidney disease stage III, constant disease, multiple sclerosis, osteoporosis currently in the ICU for respiratory distress and pneumonia  -Acute hypoxic respiratory failure-  Weaned out of BiPAP Oxygen via nasal cannula Comfort care only  -Multifocal pneumonia with sepsis Family wants comfort care only DC abx  -Hypotension secondary to septic shock Off IV pressors Comfort care only  -Renal failure Comfort measures only  - DC to hospice facility once bed is available   All the records are reviewed and case discussed with Care Management/Social Worker. Management plans discussed with the patient, family and they are in agreement.  CODE STATUS: DNR  DVT Prophylaxis: SCDs  TOTAL TIME TAKING CARE OF THIS PATIENT: 32 minutes.   POSSIBLE D/C IN 2 to 3 DAYS, DEPENDING ON CLINICAL CONDITION.  Ihor Austin M.D on 05/25/2018 at 2:16 PM  Between 7am to 6pm - Pager - [phone]  After 6pm go to [email or web address] - password [email or web address]  [facility]  Office  [phone]  CC: Primary care physician; Phys[facility], [facility]  Note: This dictation was prepared with Dragon dictation along with smaller phrase technology. Any transcriptional errors that result from this process are unintentional.

## 2018-05-25 NOTE — Progress Notes (Signed)
Called Palliative Care about order clarification. They stated they would review all orders and address them. Patient is resting comfortably in bed with family at bedside. Will continue to monitor.

## 2018-05-26 NOTE — Progress Notes (Signed)
Follow up visit made to new hospice home referral. Patient seen lying in bed, appears peaceful, will rouse to name, but drifts back to sleep. Daughter [name] at bedside and remains in agreement for transfer. Report called to the Hospice home, EMS notified for transport.Hospital care team updated. Discharge summary faxed to referral.  Dayna Barker RN, BSN, [facility] [place], hospital liaison [phone]

## 2018-05-26 NOTE — Discharge Summary (Signed)
[facility] - [place] at [facility]   PATIENT NAME: Adrienne Patel    MR#:  [id number]  DATE OF BIRTH:  1941-07-10  DATE OF ADMISSION:  05/23/2018 ADMITTING PHYSICIAN: Shaune Pollack, MD  DATE OF DISCHARGE: No discharge date for patient encounter.  PRIMARY CARE PHYSICIAN: [facility], [facility]   ADMISSION DIAGNOSIS:  respiratory distress Sepsis Hypotension DISCHARGE DIAGNOSIS:  Active Problems:   Sepsis (HCC)   Pressure injury of skin Acute hypoxic respiratory failure Septic shock Multifocal pneumonia Renal failure Multiple sclerosis  SECONDARY DIAGNOSIS:   Past Medical History:  Diagnosis Date  . CKD (chronic kidney disease)   . Crohn's disease (HCC)   . Headache   . MS (multiple sclerosis) (HCC)   . Osteoporosis   . Overactive bladder      ADMITTING HISTORY Adrienne Patel  is a 77 y.o. female with a known history of CKD, Crohn's disease, MS, osteoporosis and overactive bladder.  The patient was recently discharged to rehab from the hospital for GI bleeding.  She was found hypoxia with oxygen saturation at 60s and unable to improve with nonrebreather.  She was found tachycardia, tachypnea and hypotension in ED.  Chest x-ray show multifocal pneumonia.  BMP show acute renal failure.  Patient complains of shortness of breath, cough with sputum and generalized weakness.  HOSPITAL COURSE:  Patient was admitted to stepdown unit on BiPAP.  She was initially put on BiPAP for respiratory distress.  He received broad-spectrum IV antibiotics with vancomycin and cefepime.  She received IV fluids for renal failure and hypotension.  Patient was put on IV pressor medication for her severe sepsis and hypotension.  She was weaned of BiPAP to high flow nasal cannula oxygen.  Patient continued to deteriorate with no improvement in spite of broad-spectrum antibiotics and above measures.  Patient had hypotension, sepsis, pneumonia and renal failure. Patient was seen by nephrology  attending and intensivist attending during her hospitalization. palliative care consultation was done in the hospital.  Patient's family did not want any aggressive measures and wanted comfort care.  Comfort care measures were started and patient has a bed at hospice facility.  She is DNR by CODE STATUS and will be discharged to hospice facility.  Disposition plan discussed with family who are in agreement with the plan.  CONSULTS OBTAINED:  Treatment Team:  Mady Haagensen, MD  DRUG ALLERGIES:  No Known Allergies  DISCHARGE MEDICATIONS:   Allergies as of 05/26/2018   No Known Allergies     Medication List    STOP taking these medications   acetaminophen 325 MG tablet Commonly known as:  TYLENOL   amphetamine-dextroamphetamine 30 MG 24 hr capsule Commonly known as:  ADDERALL XR   ascorbic acid 250 MG tablet Commonly known as:  VITAMIN C   cyanocobalamin 1000 MCG/ML injection Commonly known as:  (VITAMIN B-12)   escitalopram 20 MG tablet Commonly known as:  LEXAPRO   feeding supplement (ENSURE ENLIVE) Liqd   ferrous sulfate 325 (65 FE) MG EC tablet   fesoterodine 4 MG Tb24 tablet Commonly known as:  TOVIAZ   lipase/protease/amylase 91478 UNITS Cpep capsule Commonly known as:  CREON   magnesium gluconate 500 MG tablet Commonly known as:  MAGONATE   multivitamin with minerals Tabs tablet   pantoprazole 40 MG tablet Commonly known as:  PROTONIX   sucralfate 1 g tablet Commonly known as:  CARAFATE   traMADol 50 MG tablet Commonly known as:  ULTRAM   VITAMIN D-400 400 units Tabs tablet  Generic drug:  cholecalciferol       Today  Patient lying on the bed Opens eyes sometimes and arousable to painful stimuli Not oriented to time place and person  VITAL SIGNS:  Blood pressure 111/62, pulse 97, temperature 98.1 F (36.7 C), temperature source Axillary, resp. rate 10, height 5\' 6"  (1.676 m), weight 104 lb 4.4 oz (47.3 kg), SpO2 97 %.  I/O:     Intake/Output Summary (Last 24 hours) at 05/26/2018 0823 Last data filed at 05/26/2018 0630 Gross per 24 hour  Intake 270 ml  Output 353 ml  Net -83 ml    PHYSICAL EXAMINATION:  Physical Exam  GENERAL:  77 y.o.-year-old patient lying in the bed with no acute distress.  LUNGS: Normal breath sounds bilaterally, no wheezing, rales,rhonchi or crepitation. No use of accessory muscles of respiration.  CARDIOVASCULAR: S1, S2 normal. No murmurs, rubs, or gallops.  ABDOMEN: Soft, non-tender, non-distended. Bowel sounds present. No organomegaly or mass.  NEUROLOGIC: Lethargic Decreased responsiveness PSYCHIATRIC: could not be assessed SKIN: No obvious rash, lesion, or ulcer.   DATA REVIEW:   CBC Recent Labs  Lab 05/24/18 0436  WBC 10.9  HGB 10.8*  HCT 31.9*  PLT 208    Chemistries  Recent Labs  Lab 05/23/18 0958 05/23/18 2336 05/24/18 0436  NA 133*  --  137  K 3.5  --  3.5  CL 101  --  110  CO2 15*  --  22  GLUCOSE 98  --  164*  BUN 59*  --  59*  CREATININE 4.89* 4.28* 4.00*  CALCIUM 11.0*  --  8.2*  MG  --  1.5*  --   AST 21  --   --   ALT 11  --   --   ALKPHOS 135*  --   --   BILITOT 0.6  --   --     Cardiac Enzymes Recent Labs  Lab 05/23/18 0958  TROPONINI <0.03    Microbiology Results  Results for orders placed or performed during the hospital encounter of 05/23/18  MRSA PCR Screening     Status: None   Collection Time: 05/23/18  9:25 AM  Result Value Ref Range Status   MRSA by PCR NEGATIVE NEGATIVE Final    Comment:        The GeneXpert MRSA Assay (FDA approved for NASAL specimens only), is one component of a comprehensive MRSA colonization surveillance program. It is not intended to diagnose MRSA infection nor to guide or monitor treatment for MRSA infections. Performed at [facility], [address] [place], [place] [address]   Blood culture (routine x 2)     Status: None (Preliminary result)   Collection Time: 05/23/18  9:58  AM  Result Value Ref Range Status   Specimen Description BLOOD RIGHT ARM  Final   Special Requests   Final    BOTTLES DRAWN AEROBIC AND ANAEROBIC Blood Culture adequate volume   Culture   Final    NO GROWTH 3 DAYS Performed at [facility], [address], [place], [place] [address]    Report Status PENDING  Incomplete  Blood culture (routine x 2)     Status: None (Preliminary result)   Collection Time: 05/23/18  2:01 PM  Result Value Ref Range Status   Specimen Description BLOOD RIGHT HAND  Final   Special Requests   Final    BOTTLES DRAWN AEROBIC AND ANAEROBIC Blood Culture results may not be optimal due to an inadequate volume  of blood received in culture bottles   Culture   Final    NO GROWTH 3 DAYS Performed at [facility], [address] [place], [place] [address]    Report Status PENDING  Incomplete    RADIOLOGY:  US Renal  Result Date: 05/24/2018 CLINICAL DATA:  Acute renal failure EXAM: RENAL / URINARY TRACT ULTRASOUND COMPLETE COMPARISON:  None. FINDINGS: Right Kidney: Length: 8.1 cm. Echogenicity within normal limits. No mass or hydronephrosis visualized. Left Kidney: Length: 9.0 cm. Severe hydronephrosis. 2.7 cm stone in the renal pelvis. Findings similar to prior CT. Bladder: Foley catheter in place, grossly unremarkable. Right pleural effusion noted. There is ascites noted around the liver. IMPRESSION: Severe left hydronephrosis.  2.7 cm renal pelvic stone noted. Right pleural effusion and perihepatic ascites. Electronically Signed   By: Charlett Nose M.D.   On: 05/24/2018 10:32    Follow up with PCP in 1 week.  Management plans discussed with the patient, family and they are in agreement.  CODE STATUS: DNR    Code Status Orders  (From admission, onward)        Start     Ordered   05/24/18 1224  Do not attempt resuscitation (DNR)  Continuous    Question Answer Comment  In the event of cardiac or respiratory ARREST Do not call a "code  blue"   In the event of cardiac or respiratory ARREST Do not perform Intubation, CPR, defibrillation or ACLS   In the event of cardiac or respiratory ARREST Use medication by any route, position, wound care, and other measures to relive pain and suffering. May use oxygen, suction and manual treatment of airway obstruction as needed for comfort.      05/24/18 1225    Code Status History    Date Active Date Inactive Code Status Order ID Comments User Context   05/23/2018 1232 05/24/2018 1225 DNR [id number]  Shaune Pollack, MD Inpatient   03/27/2018 0521 03/30/2018 1959 DNR [id number]  Arnaldo Natal, MD Inpatient    Advance Directive Documentation     Most Recent Value  Type of Advance Directive  Out of facility DNR (pink MOST or yellow form) [not on file]  Pre-existing out of facility DNR order (yellow form or pink MOST form)  -  "MOST" Form in Place?  -      TOTAL TIME TAKING CARE OF THIS PATIENT ON DAY OF DISCHARGE: more than 35 minutes.   Ihor Austin M.D on 05/26/2018 at 8:23 AM  Between 7am to 6pm - Pager - [phone]  After 6pm go to [email or web address] - password [email or web address]  [facility]  [facility]  Office  [phone]  CC: Primary care physician; Physicians, [facility]  Note: This dictation was prepared with Dragon dictation along with smaller phrase technology. Any transcriptional errors that result from this process are unintentional.

## 2018-05-26 NOTE — Care Management Important Message (Signed)
Important Message  Patient Details  Name: Adrienne Patel MRN: [id number] Date of Birth: September 15, 1941   Medicare Important Message Given:  Yes    Olegario Messier A [name] 05/26/2018, 10:44 AM

## 2018-05-28 LAB — CULTURE, BLOOD (ROUTINE X 2)
CULTURE: NO GROWTH
Culture: NO GROWTH
SPECIAL REQUESTS: ADEQUATE

## 2018-06-02 DIAGNOSIS — Z7189 Other specified counseling: Secondary | ICD-10-CM

## 2018-06-02 DIAGNOSIS — J189 Pneumonia, unspecified organism: Secondary | ICD-10-CM

## 2018-06-02 DIAGNOSIS — Z515 Encounter for palliative care: Secondary | ICD-10-CM

## 2018-06-02 DIAGNOSIS — N179 Acute kidney failure, unspecified: Secondary | ICD-10-CM

## 2018-06-06 ENCOUNTER — Ambulatory Visit: Payer: Medicare Other | Admitting: Gastroenterology

## 2018-06-26 DEATH — deceased

## 2020-04-06 IMAGING — CT CT ABD-PELV W/O CM
2 of 4 series · 16 of 46 positions shown, 18 images · non-contrast
Comparison: None.

CLINICAL DATA: Pain and bleeding via ileostomy site. History of
Crohn's disease.

EXAM:
CT ABDOMEN AND PELVIS WITHOUT CONTRAST
TECHNIQUE: Multidetector CT imaging of the abdomen and pelvis was performed
following the standard protocol without IV contrast. Enteric
contrast administered.

[Series 2: routine abd/pel wo · axial · 0.86mm/px · z∈[-1102,-702]mm · 13 of 88 slices shown, 15 images]
[im 4/88  soft-tissue]
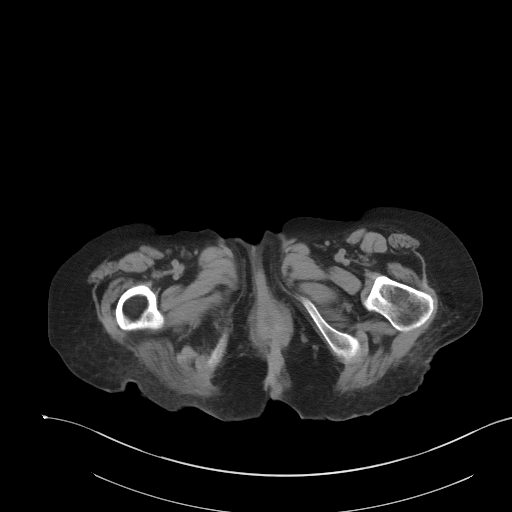
[im 4/88  bone]
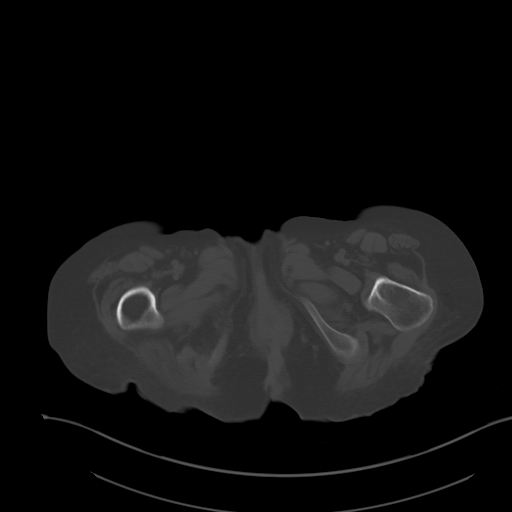
[im 12/88  soft-tissue]
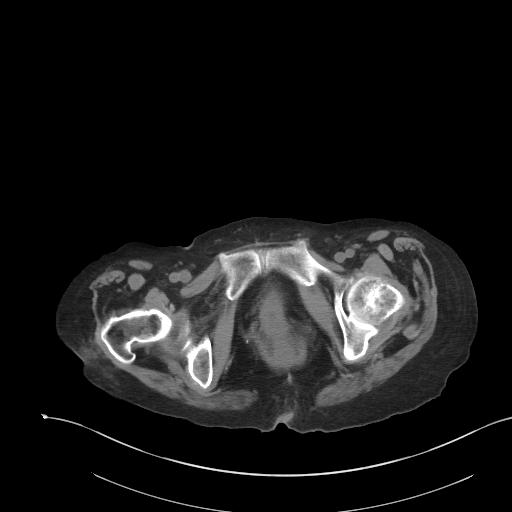
[im 20/88  soft-tissue]
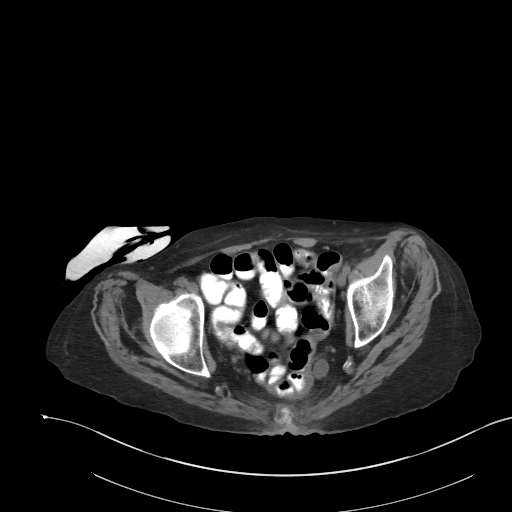
[im 24/88  soft-tissue]
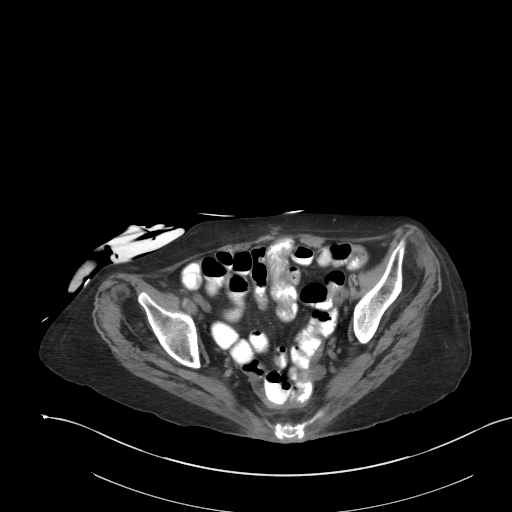
[im 32/88  soft-tissue]
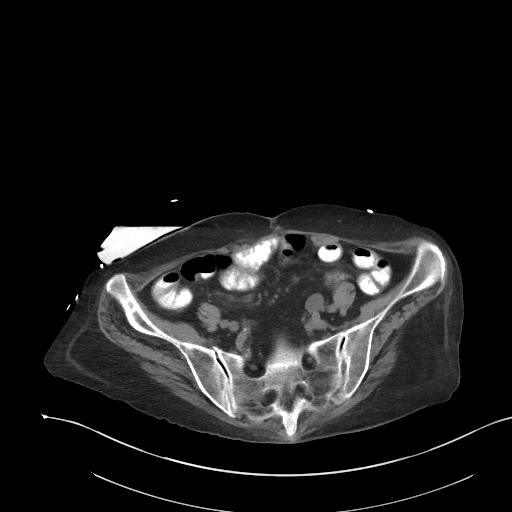
[im 36/88  soft-tissue]
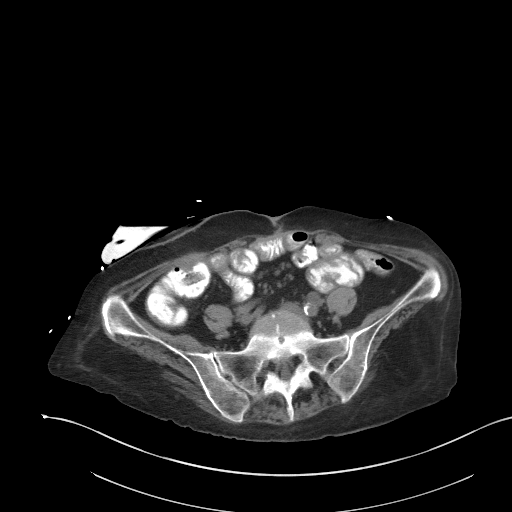
[im 44/88  soft-tissue]
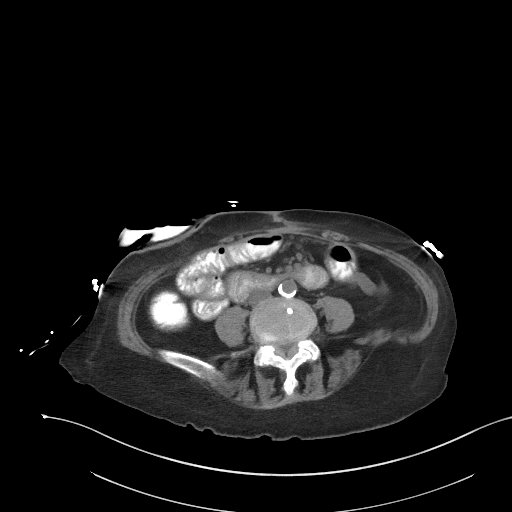
[im 52/88  soft-tissue]
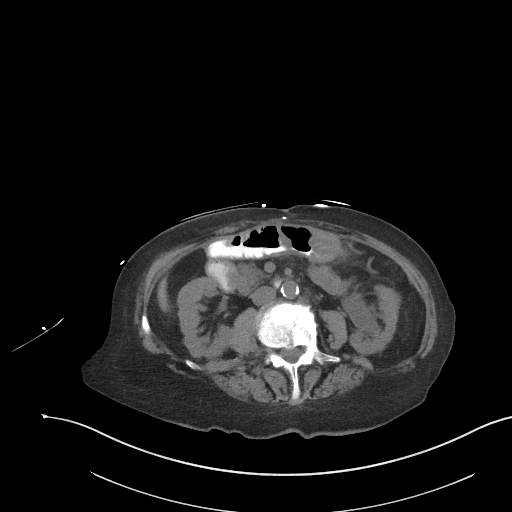
[im 56/88  soft-tissue]
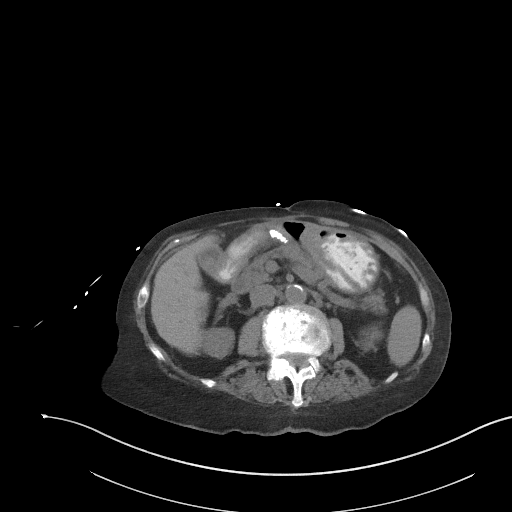
[im 56/88  bone]
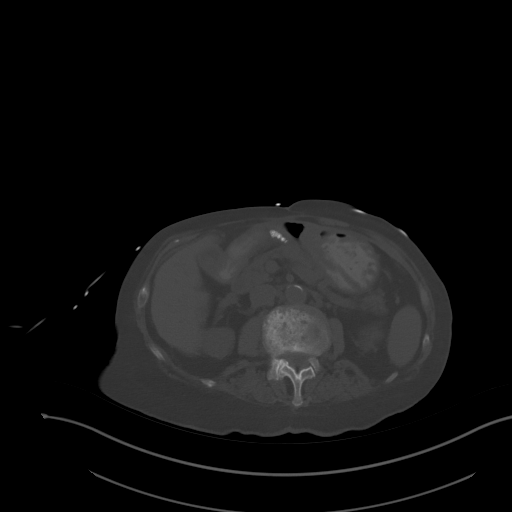
[im 64/88  soft-tissue]
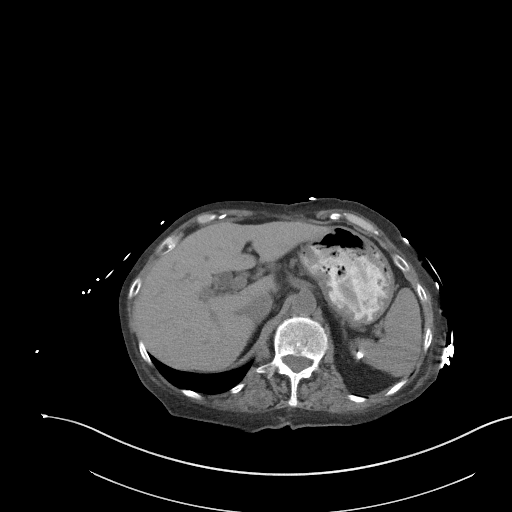
[im 68/88  soft-tissue]
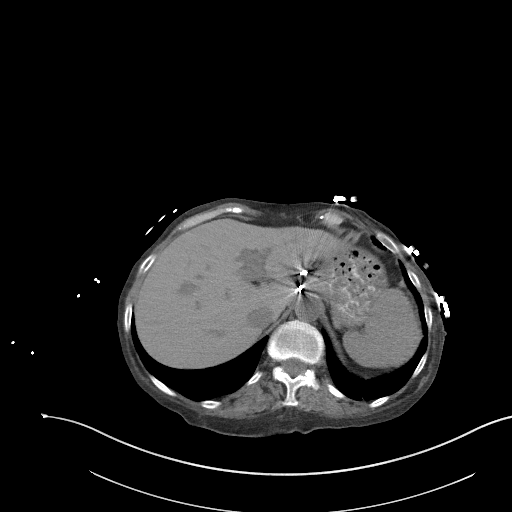
[im 76/88  soft-tissue]
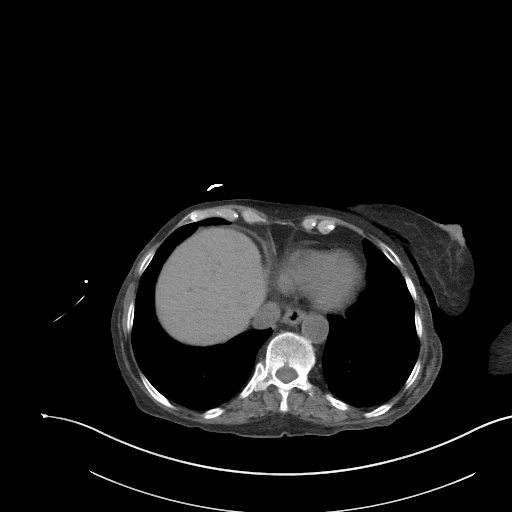
[im 84/88  soft-tissue]
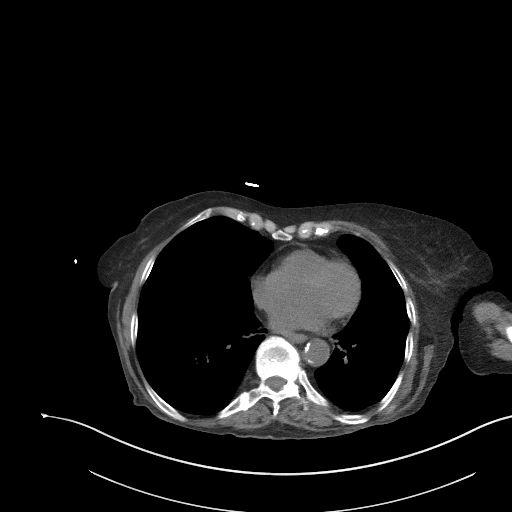

[Series 5: coronal st · coronal · 0.72mm/px · 3 of 71 slices shown]
[im 24/71  soft-tissue]
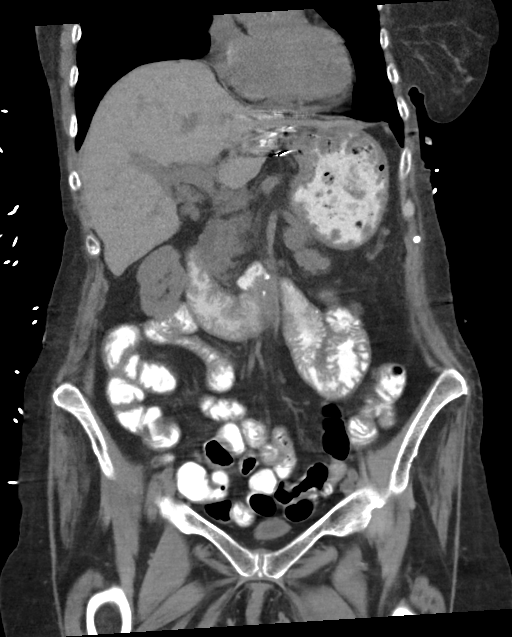
[im 32/71  soft-tissue]
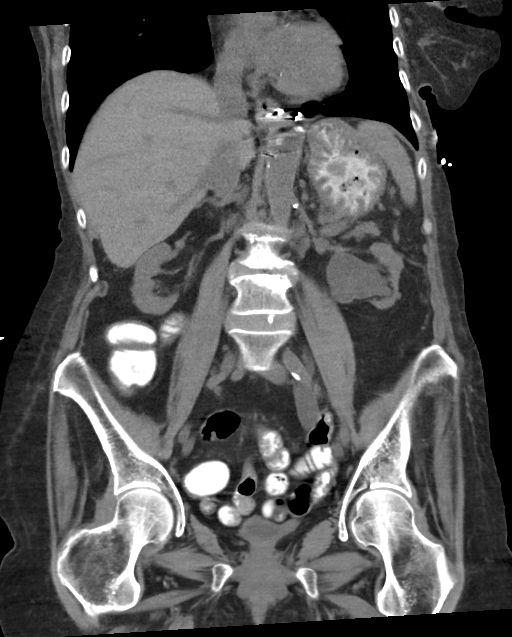
[im 39/71  soft-tissue]
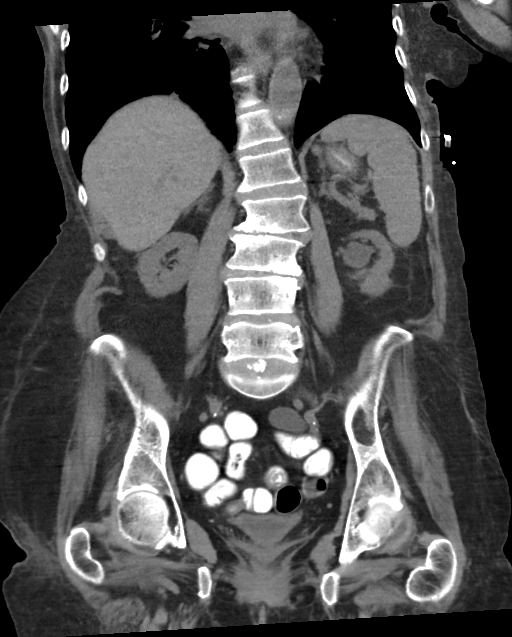

[16 of 46 positions shown; findings below may reference images not displayed]

FINDINGS: LOWER CHEST: Minimal atelectasis/scarring. The visualized heart size
is normal. Partially imaged coronary artery calcification. No
pericardial effusion.

HEPATOBILIARY: Multiple mildly dense gallstones with mild
gallbladder wall thickening. Common bile duct is 15 mm in transaxial
dimension. No identified choledocholithiasis.

PANCREAS: Normal.

SPLEEN: Normal.

ADRENALS/URINARY TRACT: Mildly low-lying LEFT kidney with moderate
hydroureteronephrosis. Mildly atrophic LEFT kidney. 1 x 2 cm LEFT
renal pelvis calculus. Punctate nonobstructing RIGHT
nephrolithiasis. Limited assessment for renal masses by nonenhanced
CT. Subcentimeter RIGHT upper pole dense exophytic probable
hemorrhagic cyst. The unopacified ureters are normal in course and
caliber. Urinary bladder is partially distended and unremarkable.
Normal adrenal glands.

STOMACH/BOWEL: Surgical clips about the GE junction compatible with
fundoplication. Status post gastro jejunostomy with thickened
appearance of proximal anastomosis. Contrast in the biliary loop and
feeding loops. Contrast to the level of the ileostomy and within
bag. Status post colectomy.

VASCULAR/LYMPHATIC: Aortoiliac vessels are normal in course and
caliber. Moderate calcific atherosclerosis. No lymphadenopathy by CT
size criteria.

REPRODUCTIVE: Status post hysterectomy.

OTHER: No intraperitoneal free fluid or free air.

MUSCULOSKELETAL: Non-acute. Thoracolumbar levoscoliosis. Osteopenia.
Severe L2-3 and L3-4 degenerative discs. Anterior abdominal wall
scarring.
IMPRESSION: 1. Cholelithiasis and suspected acute cholecystitis. Biliary
dilatation without identified choledocholithiasis.
2. Age indeterminate thickened gastrojejunostomy anastomosis,
possibly inflammatory without bowel obstruction. Status post
colectomy.
3. Moderate LEFT hydroureteronephrosis, UVJ stricture or mass
possible. 1 x 2 cm LEFT nephrolithiasis.

## 2020-06-03 IMAGING — US US RENAL
1 series · 14 of 25 positions shown · non-contrast
Comparison: None.

CLINICAL DATA: Acute renal failure

EXAM:
RENAL / URINARY TRACT ULTRASOUND COMPLETE

[Series 1: us renal · 14 of 43 slices shown]
[im 1/43]
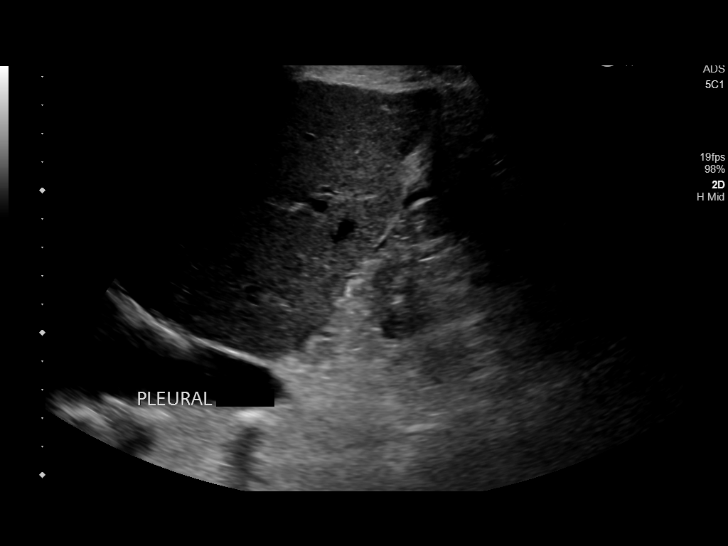
[im 4/43]
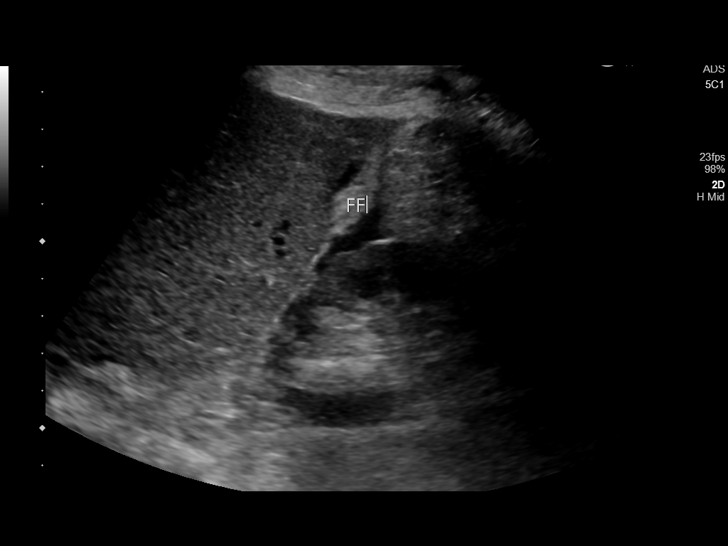
[im 8/43]
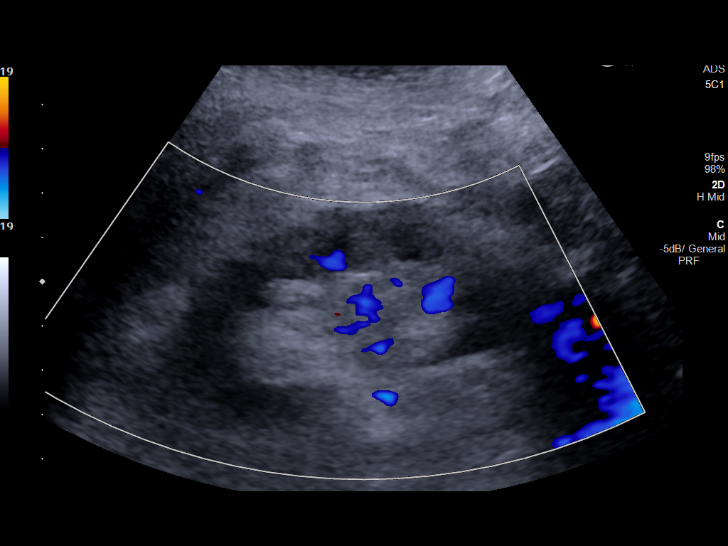
[im 11/43]
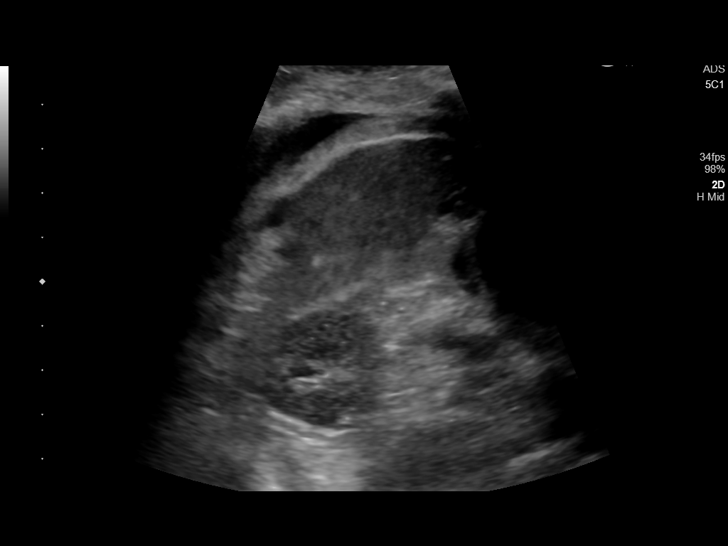
[im 15/43]
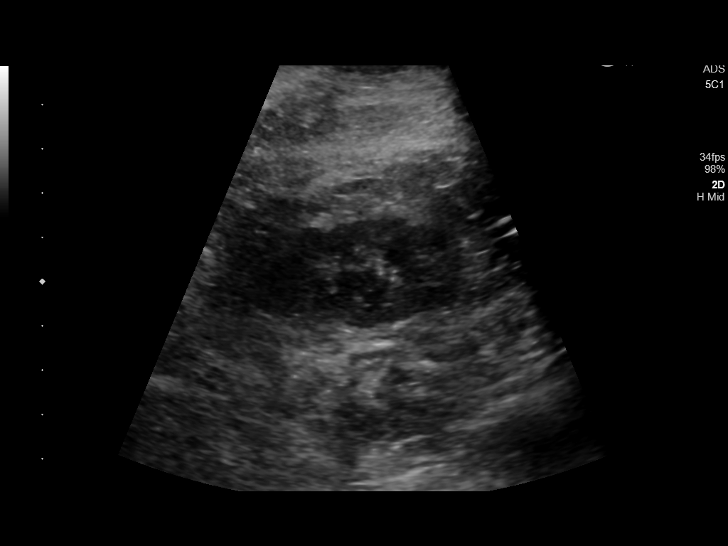
[im 16/43]
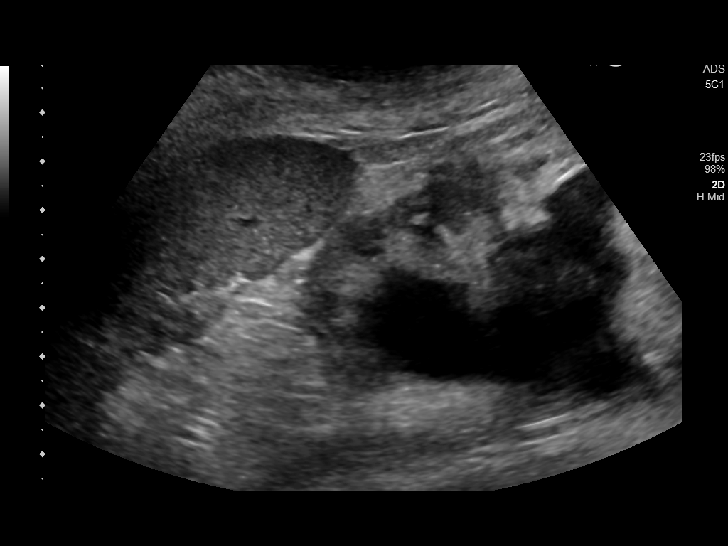
[im 20/43]
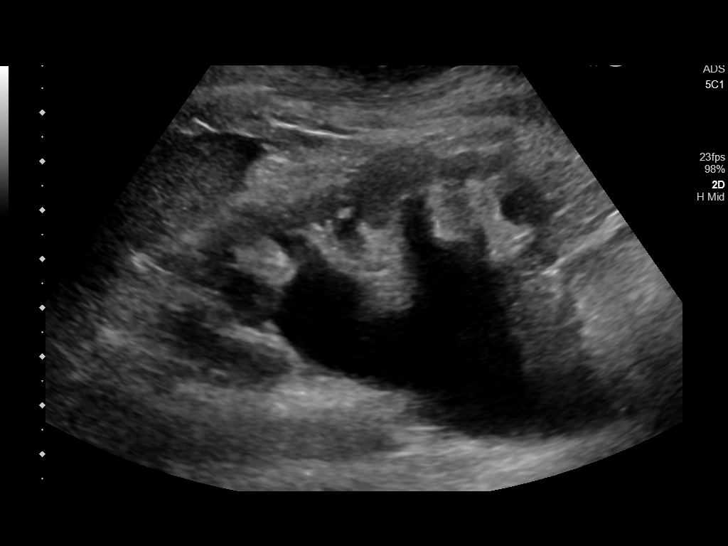
[im 23/43]
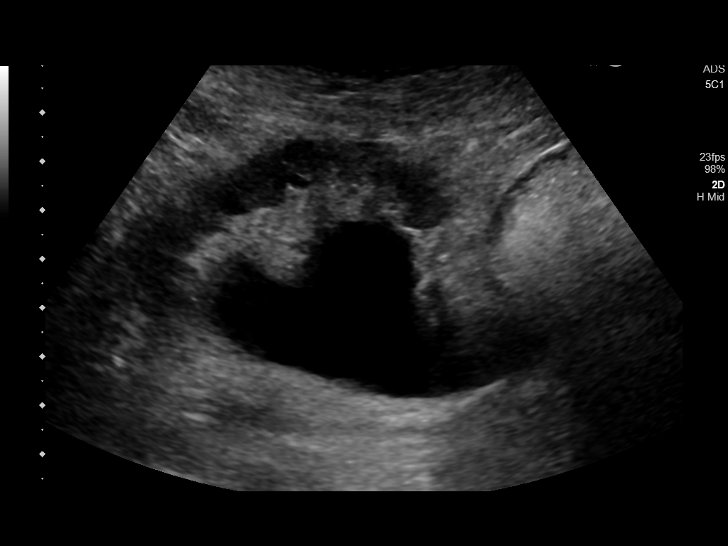
[im 27/43]
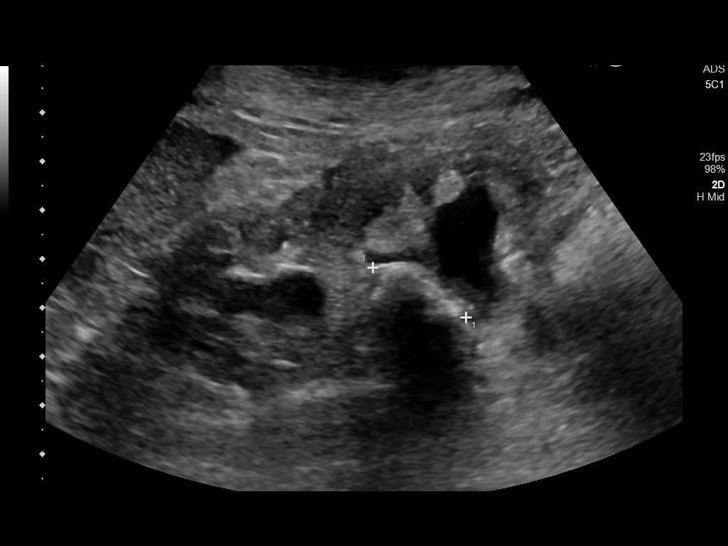
[im 29/43]
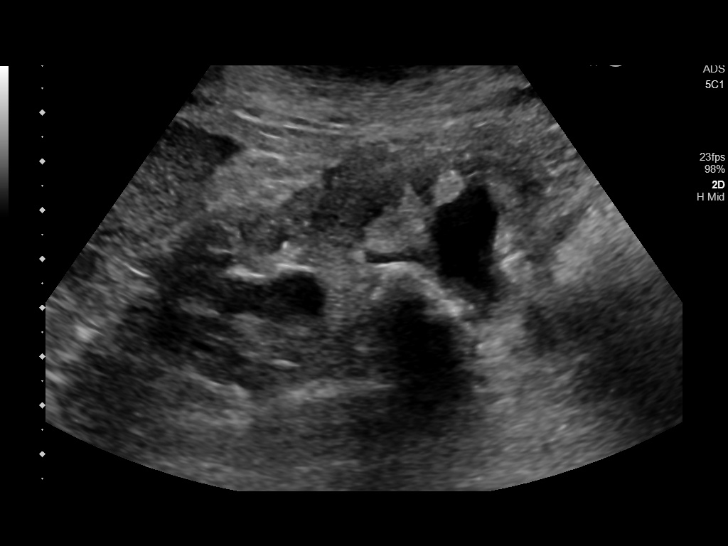
[im 32/43]
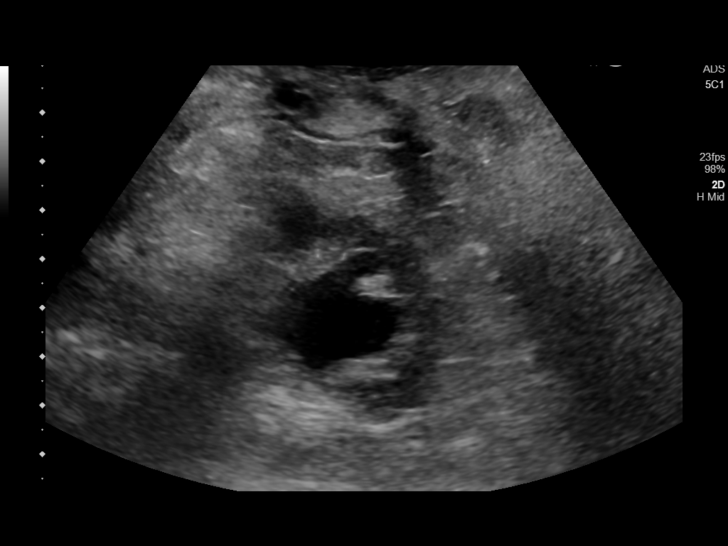
[im 36/43]
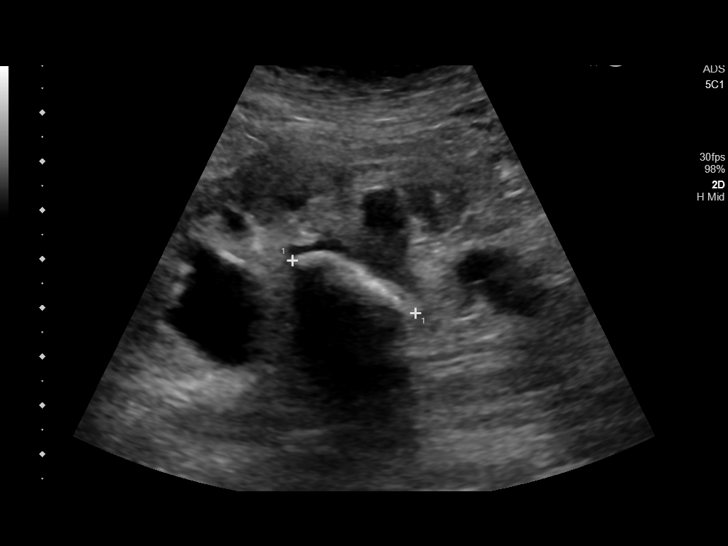
[im 39/43]
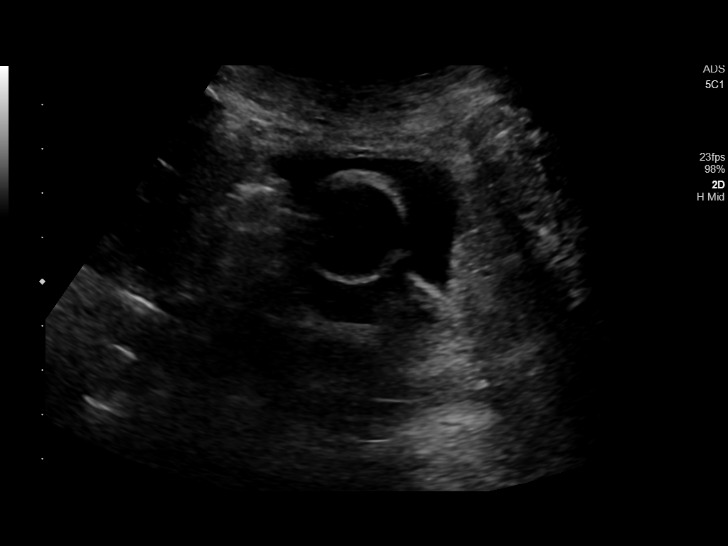
[im 43/43]
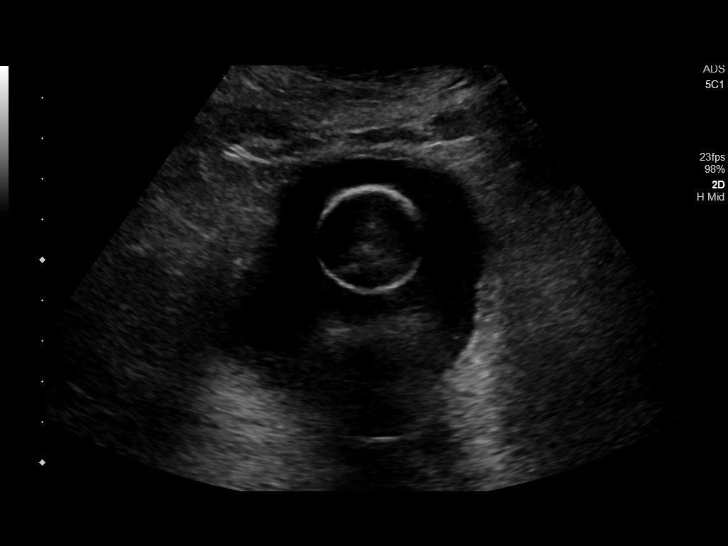

[14 of 25 positions shown; findings below may reference images not displayed]

FINDINGS: Right Kidney:

Length: 8.1 cm. Echogenicity within normal limits. No mass or
hydronephrosis visualized.

Left Kidney:

Length: 9.0 cm. Severe hydronephrosis. 2.7 cm stone in the renal
pelvis. Findings similar to prior CT.

Bladder:

Foley catheter in place, grossly unremarkable.

Right pleural effusion noted. There is ascites noted around the
liver.
IMPRESSION: Severe left hydronephrosis.  2.7 cm renal pelvic stone noted.

Right pleural effusion and perihepatic ascites.
